# Patient Record
Sex: Female | Born: 1935 | Race: White | Hispanic: No
Health system: Southern US, Community
[De-identification: ages and names within clinical notes are randomized; demographics above are authoritative.]

## PROBLEM LIST (undated history)

## (undated) DIAGNOSIS — F0390 Unspecified dementia without behavioral disturbance: Secondary | ICD-10-CM

## (undated) DIAGNOSIS — I10 Essential (primary) hypertension: Secondary | ICD-10-CM

## (undated) DIAGNOSIS — E079 Disorder of thyroid, unspecified: Secondary | ICD-10-CM

## (undated) DIAGNOSIS — F419 Anxiety disorder, unspecified: Secondary | ICD-10-CM

## (undated) DIAGNOSIS — F329 Major depressive disorder, single episode, unspecified: Secondary | ICD-10-CM

---

## 2015-09-29 ENCOUNTER — Inpatient Hospital Stay (HOSPITAL_COMMUNITY)
Admission: EM | Admit: 2015-09-29 | Discharge: 2015-10-03 | DRG: 481 | Disposition: A | Discharge status: Skilled Nursing Facility | Payer: Medicare Other | Attending: Internal Medicine | Admitting: Internal Medicine

## 2015-09-29 ENCOUNTER — Emergency Department (HOSPITAL_COMMUNITY): Payer: Medicare Other

## 2015-09-29 ENCOUNTER — Encounter (HOSPITAL_COMMUNITY): Payer: Self-pay | Admitting: *Deleted

## 2015-09-29 DIAGNOSIS — I10 Essential (primary) hypertension: Secondary | ICD-10-CM | POA: Diagnosis present

## 2015-09-29 DIAGNOSIS — Z9109 Other allergy status, other than to drugs and biological substances: Secondary | ICD-10-CM | POA: Diagnosis not present

## 2015-09-29 DIAGNOSIS — Z66 Do not resuscitate: Secondary | ICD-10-CM | POA: Diagnosis present

## 2015-09-29 DIAGNOSIS — F0280 Dementia in other diseases classified elsewhere without behavioral disturbance: Secondary | ICD-10-CM | POA: Diagnosis present

## 2015-09-29 DIAGNOSIS — Z419 Encounter for procedure for purposes other than remedying health state, unspecified: Secondary | ICD-10-CM

## 2015-09-29 DIAGNOSIS — Y92129 Unspecified place in nursing home as the place of occurrence of the external cause: Secondary | ICD-10-CM | POA: Diagnosis not present

## 2015-09-29 DIAGNOSIS — N390 Urinary tract infection, site not specified: Secondary | ICD-10-CM

## 2015-09-29 DIAGNOSIS — F418 Other specified anxiety disorders: Secondary | ICD-10-CM | POA: Diagnosis not present

## 2015-09-29 DIAGNOSIS — E039 Hypothyroidism, unspecified: Secondary | ICD-10-CM | POA: Diagnosis present

## 2015-09-29 DIAGNOSIS — E876 Hypokalemia: Secondary | ICD-10-CM | POA: Diagnosis present

## 2015-09-29 DIAGNOSIS — Z79899 Other long term (current) drug therapy: Secondary | ICD-10-CM | POA: Diagnosis not present

## 2015-09-29 DIAGNOSIS — S72009A Fracture of unspecified part of neck of unspecified femur, initial encounter for closed fracture: Secondary | ICD-10-CM | POA: Diagnosis present

## 2015-09-29 DIAGNOSIS — S72012A Unspecified intracapsular fracture of left femur, initial encounter for closed fracture: Secondary | ICD-10-CM | POA: Diagnosis present

## 2015-09-29 DIAGNOSIS — F419 Anxiety disorder, unspecified: Secondary | ICD-10-CM | POA: Diagnosis present

## 2015-09-29 DIAGNOSIS — S72142A Displaced intertrochanteric fracture of left femur, initial encounter for closed fracture: Secondary | ICD-10-CM | POA: Diagnosis not present

## 2015-09-29 DIAGNOSIS — W19XXXA Unspecified fall, initial encounter: Secondary | ICD-10-CM | POA: Diagnosis present

## 2015-09-29 DIAGNOSIS — F329 Major depressive disorder, single episode, unspecified: Secondary | ICD-10-CM | POA: Diagnosis present

## 2015-09-29 DIAGNOSIS — D62 Acute posthemorrhagic anemia: Secondary | ICD-10-CM | POA: Diagnosis not present

## 2015-09-29 DIAGNOSIS — S72142S Displaced intertrochanteric fracture of left femur, sequela: Secondary | ICD-10-CM | POA: Diagnosis not present

## 2015-09-29 DIAGNOSIS — F0390 Unspecified dementia without behavioral disturbance: Secondary | ICD-10-CM | POA: Diagnosis present

## 2015-09-29 DIAGNOSIS — E440 Moderate protein-calorie malnutrition: Secondary | ICD-10-CM | POA: Diagnosis present

## 2015-09-29 DIAGNOSIS — F32A Depression, unspecified: Secondary | ICD-10-CM | POA: Diagnosis present

## 2015-09-29 HISTORY — DX: Anxiety disorder, unspecified: F41.9

## 2015-09-29 HISTORY — DX: Disorder of thyroid, unspecified: E07.9

## 2015-09-29 HISTORY — DX: Unspecified dementia, unspecified severity, without behavioral disturbance, psychotic disturbance, mood disturbance, and anxiety: F03.90

## 2015-09-29 HISTORY — DX: Essential (primary) hypertension: I10

## 2015-09-29 HISTORY — DX: Major depressive disorder, single episode, unspecified: F32.9

## 2015-09-29 LAB — URINE MICROSCOPIC-ADD ON

## 2015-09-29 LAB — COMPREHENSIVE METABOLIC PANEL
ALK PHOS: 43 U/L (ref 38–126)
ALT: 25 U/L (ref 14–54)
ANION GAP: 6 (ref 5–15)
AST: 29 U/L (ref 15–41)
Albumin: 4.1 g/dL (ref 3.5–5.0)
BILIRUBIN TOTAL: 1.3 mg/dL — AB (ref 0.3–1.2)
BUN: 32 mg/dL — ABNORMAL HIGH (ref 6–20)
CALCIUM: 8.5 mg/dL — AB (ref 8.9–10.3)
CO2: 27 mmol/L (ref 22–32)
CREATININE: 0.77 mg/dL (ref 0.44–1.00)
Chloride: 104 mmol/L (ref 101–111)
Glucose, Bld: 107 mg/dL — ABNORMAL HIGH (ref 65–99)
Potassium: 3.4 mmol/L — ABNORMAL LOW (ref 3.5–5.1)
Sodium: 137 mmol/L (ref 135–145)
TOTAL PROTEIN: 6.4 g/dL — AB (ref 6.5–8.1)

## 2015-09-29 LAB — CBC WITH DIFFERENTIAL/PLATELET
BASOS ABS: 0 10*3/uL (ref 0.0–0.1)
BASOS PCT: 0 %
EOS ABS: 0.1 10*3/uL (ref 0.0–0.7)
Eosinophils Relative: 2 %
HCT: 39.4 % (ref 36.0–46.0)
HEMOGLOBIN: 13.1 g/dL (ref 12.0–15.0)
Lymphocytes Relative: 17 %
Lymphs Abs: 1.2 10*3/uL (ref 0.7–4.0)
MCH: 31.4 pg (ref 26.0–34.0)
MCHC: 33.2 g/dL (ref 30.0–36.0)
MCV: 94.5 fL (ref 78.0–100.0)
MONOS PCT: 5 %
Monocytes Absolute: 0.4 10*3/uL (ref 0.1–1.0)
NEUTROS ABS: 5.5 10*3/uL (ref 1.7–7.7)
NEUTROS PCT: 76 %
Platelets: 163 10*3/uL (ref 150–400)
RBC: 4.17 MIL/uL (ref 3.87–5.11)
RDW: 13.1 % (ref 11.5–15.5)
WBC: 7.2 10*3/uL (ref 4.0–10.5)

## 2015-09-29 LAB — CBC
HCT: 37.3 % (ref 36.0–46.0)
HEMOGLOBIN: 12.1 g/dL (ref 12.0–15.0)
MCH: 30.9 pg (ref 26.0–34.0)
MCHC: 32.4 g/dL (ref 30.0–36.0)
MCV: 95.2 fL (ref 78.0–100.0)
Platelets: 165 10*3/uL (ref 150–400)
RBC: 3.92 MIL/uL (ref 3.87–5.11)
RDW: 13 % (ref 11.5–15.5)
WBC: 6.9 10*3/uL (ref 4.0–10.5)

## 2015-09-29 LAB — TSH: TSH: 17.851 u[IU]/mL — ABNORMAL HIGH (ref 0.350–4.500)

## 2015-09-29 LAB — CREATININE, SERUM
CREATININE: 0.7 mg/dL (ref 0.44–1.00)
GFR calc Af Amer: >60 mL/min (ref >60)

## 2015-09-29 LAB — SAMPLE TO BLOOD BANK

## 2015-09-29 LAB — URINALYSIS, ROUTINE W REFLEX MICROSCOPIC
BILIRUBIN URINE: NEGATIVE
GLUCOSE, UA: NEGATIVE mg/dL
KETONES UR: NEGATIVE mg/dL
NITRITE: POSITIVE — AB
PH: 6 (ref 5.0–8.0)
PROTEIN: NEGATIVE mg/dL
Specific Gravity, Urine: 1.025 (ref 1.005–1.030)

## 2015-09-29 LAB — PROTIME-INR
INR: 0.99 (ref 0.00–1.49)
PROTHROMBIN TIME: 13.3 s (ref 11.6–15.2)

## 2015-09-29 LAB — APTT: APTT: 31 s (ref 24–37)

## 2015-09-29 LAB — MRSA PCR SCREENING: MRSA by PCR: NEGATIVE

## 2015-09-29 MED ORDER — HYDROCODONE-ACETAMINOPHEN 5-325 MG PO TABS
1.0000 | ORAL_TABLET | Freq: Four times a day (QID) | ORAL | Status: DC | PRN
  Administered 2015-09-29: 1 via ORAL
  Administered 2015-09-30: 2 via ORAL
  Filled 2015-09-29: qty 2
  Filled 2015-09-29: qty 1

## 2015-09-29 MED ORDER — DOCUSATE SODIUM 100 MG PO CAPS
100.0000 mg | ORAL_CAPSULE | Freq: Every day | ORAL | Status: DC
Start: 2015-09-29 — End: 2015-09-30
  Administered 2015-09-30: 100 mg via ORAL
  Filled 2015-09-29 (×2): qty 1

## 2015-09-29 MED ORDER — HYDROCODONE-ACETAMINOPHEN 5-325 MG PO TABS: 1.0000 | ORAL_TABLET | Freq: Four times a day (QID) | ORAL | Status: DC | PRN

## 2015-09-29 MED ORDER — DEXTROSE 5 % IV SOLN
1.0000 g | Freq: Once | INTRAVENOUS | Status: AC
  Administered 2015-09-29: 1 g via INTRAVENOUS
  Filled 2015-09-29: qty 10

## 2015-09-29 MED ORDER — BUSPIRONE HCL 5 MG PO TABS
15.0000 mg | ORAL_TABLET | Freq: Two times a day (BID) | ORAL | Status: DC
  Administered 2015-09-29 – 2015-10-03 (×8): 15 mg via ORAL
  Filled 2015-09-29 (×8): qty 1

## 2015-09-29 MED ORDER — ENOXAPARIN SODIUM 40 MG/0.4ML ~~LOC~~ SOLN: 40.0000 mg | SUBCUTANEOUS | Status: DC

## 2015-09-29 MED ORDER — SODIUM CHLORIDE 0.9 % IV SOLN
INTRAVENOUS | Status: DC
  Administered 2015-09-29: 1000 mL via INTRAVENOUS

## 2015-09-29 MED ORDER — FENTANYL CITRATE (PF) 100 MCG/2ML IJ SOLN
25.0000 ug | Freq: Once | INTRAMUSCULAR | Status: AC
  Administered 2015-09-29: 25 ug via INTRAVENOUS
  Filled 2015-09-29: qty 2

## 2015-09-29 MED ORDER — MORPHINE SULFATE (PF) 2 MG/ML IV SOLN
0.5000 mg | INTRAVENOUS | Status: DC | PRN
  Administered 2015-10-01: 0.5 mg via INTRAVENOUS
  Filled 2015-09-29: qty 1

## 2015-09-29 MED ORDER — LEVOTHYROXINE SODIUM 75 MCG PO TABS
75.0000 ug | ORAL_TABLET | Freq: Every day | ORAL | Status: DC
  Administered 2015-09-30 – 2015-10-01 (×2): 75 ug via ORAL
  Filled 2015-09-29 (×2): qty 1

## 2015-09-29 MED ORDER — ACETAMINOPHEN 500 MG PO TABS
500.0000 mg | ORAL_TABLET | Freq: Four times a day (QID) | ORAL | Status: DC | PRN
  Administered 2015-09-29: 500 mg via ORAL
  Filled 2015-09-29 (×2): qty 1

## 2015-09-29 MED ORDER — FENTANYL CITRATE (PF) 100 MCG/2ML IJ SOLN
25.0000 ug | Freq: Once | INTRAMUSCULAR | Status: AC
Start: 2015-09-29 — End: 2015-09-29
  Administered 2015-09-29: 25 ug via INTRAVENOUS
  Filled 2015-09-29: qty 2

## 2015-09-29 MED ORDER — ATENOLOL 12.5 MG HALF TABLET
12.5000 mg | ORAL_TABLET | Freq: Every day | ORAL | Status: DC
  Administered 2015-09-30 – 2015-10-03 (×3): 12.5 mg via ORAL
  Filled 2015-09-29 (×5): qty 1

## 2015-09-29 MED ORDER — ENOXAPARIN SODIUM 30 MG/0.3ML ~~LOC~~ SOLN
30.0000 mg | SUBCUTANEOUS | Status: DC
  Filled 2015-09-29 (×2): qty 0.3

## 2015-09-29 MED ORDER — SENNA 8.6 MG PO TABS
1.0000 | ORAL_TABLET | Freq: Two times a day (BID) | ORAL | Status: DC
  Administered 2015-09-29 – 2015-10-03 (×8): 8.6 mg via ORAL
  Filled 2015-09-29 (×8): qty 1

## 2015-09-29 MED ORDER — MORPHINE SULFATE (PF) 2 MG/ML IV SOLN
0.5000 mg | INTRAVENOUS | Status: DC | PRN
Start: 2015-09-29 — End: 2015-09-29

## 2015-09-29 MED ORDER — ESCITALOPRAM OXALATE 10 MG PO TABS
15.0000 mg | ORAL_TABLET | Freq: Every day | ORAL | Status: DC
  Administered 2015-09-30 – 2015-10-03 (×4): 15 mg via ORAL
  Filled 2015-09-29 (×5): qty 2

## 2015-09-29 MED ORDER — POTASSIUM CHLORIDE 10 MEQ/100ML IV SOLN
10.0000 meq | INTRAVENOUS | Status: AC
  Administered 2015-09-29 (×3): 10 meq via INTRAVENOUS
  Filled 2015-09-29 (×3): qty 100

## 2015-09-29 NOTE — H&P (Signed)
History and Physical    Debra Flores ZOX:[id number] DOB: [birth year] DOA: 09/29/2015  PCP: No primary care provider on file. Consultants:  ? Patient coming from: SNF  Chief Complaint: fall  HPI: Debra Flores is a 82 y.o. female with medical history significant of advanced dementia.  She was brought in by EMS after falling at the SNF where she lives.  She c/o pain in her right hip with movement, per Dr. Delford FieldKnapp's note.  The patient is quite tangential and unable to answer any questions appropriately.  Her son has been notified of the situation [place]; he confirms that she is DNR.    ED Course:  Given Rocephin 1 gram IV for possible UTI; found to have a left intertrochanteric hip fracture and so [facility]  Review of Systems: Unable to perform due to advanced dementia    Past Medical History  Diagnosis Date  . Anxiety   . Thyroid disease   . Hypertension   . Depression, major (HCC)   . Dementia     History reviewed. No pertinent past surgical history.  Social History   Social History  . Marital Status: Widowed    Spouse Name: N/A  . Number of Children: N/A  . Years of Education: N/A   Occupational History  . Not on file.   Social History Main Topics  . Smoking status: Unknown If Ever Smoked  . Smokeless tobacco: Not on file  . Alcohol Use: No  . Drug Use: No  . Sexual Activity: Not on file   Other Topics Concern  . Not on file   Social History Narrative  . No narrative on file    Allergies  Allergen Reactions  . Aricept [Donepezil Hcl]     History reviewed. No pertinent family history.  Prior to Admission medications   Medication Sig Start Date End Date Taking? Authorizing Provider  acetaminophen (TYLENOL) 500 MG tablet Take 500 mg by mouth 2 (two) times daily.   Yes Historical Provider, MD  atenolol (TENORMIN) 25 MG tablet Take 12.5 mg by mouth daily.   Yes Historical Provider, MD  busPIRone (BUSPAR)  15 MG tablet Take 15 mg by mouth 2 (two) times daily.   Yes Historical Provider, MD  docusate sodium (COLACE) 100 MG capsule Take 100 mg by mouth daily.   Yes Historical Provider, MD  escitalopram (LEXAPRO) 10 MG tablet Take 15 mg by mouth daily.   Yes Historical Provider, MD  levothyroxine (SYNTHROID, LEVOTHROID) 50 MCG tablet Take 75 mcg by mouth daily before breakfast.   Yes Historical Provider, MD  Skin Protectants, Misc. (EUCERIN) cream Apply topically 2 (two) times daily.   Yes Historical Provider, MD    Physical Exam: Filed Vitals:   09/29/15 0218 09/29/15 0456 09/29/15 0626  BP: 167/87 150/79 176/78  Pulse: 67 86 83  Temp: 97.9 F (36.6 C)    TempSrc: Oral    Resp: 22 20 20   Weight: 45.36 kg (100 lb)    SpO2: 98% 96% 99%     General:  Appears calm and comfortable and is NAD Eyes:  PERRL, EOMI, normal lids, iris ENT:  grossly normal hearing, lips & tongue, mmm Neck:  no LAD, masses or thyromegaly Cardiovascular:  RRR, no m/r/g. No LE edema.  Respiratory:  CTA bilaterally, no w/r/r. Normal respiratory effort. Abdomen:  soft, ntnd, NABS Skin:  no rash or induration seen on limited exam Musculoskeletal: legs are symmetric and no leg  length discrepancy appreciated; 2+ distal pulses bilaterally Psychiatric:  grossly normal mood and affect, speech fluent, alert, spoke with me about whether I would be doing the dishes and various other nonsensical things but unable to answer questions appropriately Neurologic: grossly intact, unable to participate  Labs on Admission: I have personally reviewed following labs and imaging studies  CBC:  Recent Labs Lab 09/29/15 0412  WBC 7.2  NEUTROABS 5.5  HGB 13.1  HCT 39.4  MCV 94.5  PLT 163   Basic Metabolic Panel:  Recent Labs Lab 09/29/15 0412  NA 137  K 3.4*  CL 104  CO2 27  GLUCOSE 107*  BUN 32*  CREATININE 0.77  CALCIUM 8.5*   GFR: CrCl cannot be calculated (Unknown ideal weight.). Liver Function Tests:  Recent  Labs Lab 09/29/15 0412  AST 29  ALT 25  ALKPHOS 43  BILITOT 1.3*  PROT 6.4*  ALBUMIN 4.1   No results for input(s): LIPASE, AMYLASE in the last 168 hours. No results for input(s): AMMONIA in the last 168 hours. Coagulation Profile:  Recent Labs Lab 09/29/15 0412  INR 0.99   Cardiac Enzymes: No results for input(s): CKTOTAL, CKMB, CKMBINDEX, TROPONINI in the last 168 hours. BNP (last 3 results) No results for input(s): PROBNP in the last 8760 hours. HbA1C: No results for input(s): HGBA1C in the last 72 hours. CBG: No results for input(s): GLUCAP in the last 168 hours. Lipid Profile: No results for input(s): CHOL, HDL, LDLCALC, TRIG, CHOLHDL, LDLDIRECT in the last 72 hours. Thyroid Function Tests: No results for input(s): TSH, T4TOTAL, FREET4, T3FREE, THYROIDAB in the last 72 hours. Anemia Panel: No results for input(s): VITAMINB12, FOLATE, FERRITIN, TIBC, IRON, RETICCTPCT in the last 72 hours. Urine analysis:    Component Value Date/Time   COLORURINE YELLOW 09/29/2015 0440   APPEARANCEUR CLOUDY* 09/29/2015 0440   LABSPEC 1.025 09/29/2015 0440   PHURINE 6.0 09/29/2015 0440   GLUCOSEU NEGATIVE 09/29/2015 0440   HGBUR TRACE* 09/29/2015 0440   BILIRUBINUR NEGATIVE 09/29/2015 0440   KETONESUR NEGATIVE 09/29/2015 0440   PROTEINUR NEGATIVE 09/29/2015 0440   NITRITE POSITIVE* 09/29/2015 0440   LEUKOCYTESUR MODERATE* 09/29/2015 0440    Creatinine Clearance: CrCl cannot be calculated (Unknown ideal weight.).  Sepsis Labs: (procalcitonin:4,lacticidven:4) )No results found for this or any previous visit (from the past 240 hour(s)).   Radiological Exams on Admission: Dg Chest 1 View  09/29/2015  CLINICAL DATA:  Unwitnessed fall.  Hip fracture. EXAM: CHEST 1 VIEW COMPARISON:  None. FINDINGS: Mild cardiomegaly. There is tortuosity of the thoracic aorta with atherosclerosis. Mild coarse interstitial markings without evidence of pulmonary edema. No confluent airspace  disease, pleural effusion or pneumothorax. Scoliotic curvature in degenerative change in the spine. No evidence of displaced rib fracture. IMPRESSION: Mild cardiomegaly and tortuous atherosclerotic thoracic aorta. No definite acute abnormality. Electronically Signed   By: Rubye Oaks M.D.   On: 09/29/2015 04:01   Dg Pelvis 1-2 Views  09/29/2015  CLINICAL DATA:  82 year old post unwitnessed fall at nursing facility this morning. EXAM: PELVIS - 1-2 VIEW COMPARISON:  None. FINDINGS: Left subcapital femoral neck fracture with mild foreshortening. Right hip is grossly intact. Two weeks of the cysts and sacroiliac joints are congruent. No evidence of pelvic ring fracture. The bones are under mineralized. There is degenerative change of the lower lumbar spine. A stool ball distends the rectum. IMPRESSION: Mildly displaced left subcapital femoral neck fracture. Electronically Signed   By: Rubye Oaks M.D.   On: 09/29/2015 03:16  Dg Hip Unilat With Pelvis 2-3 Views Left  09/29/2015  CLINICAL DATA:  Unwitnessed fall at nursing facility this morning. Hip fracture. EXAM: DG HIP (WITH OR WITHOUT PELVIS) 2-3V LEFT COMPARISON:  Pelvis radiograph earlier this day. FINDINGS: The left proximal femur fracture involves the intertrochanteric femur. The femoral neck is intact. There is mild displacement. Femoral head is seated in the acetabulum. No rami fractures. IMPRESSION: Left proximal femur fracture involves the intertrochanteric femur, not the femoral neck as reported on prior exam. The femoral neck appears intact. Electronically Signed   By: Rubye Oaks M.D.   On: 09/29/2015 04:00    EKG: Not done  Assessment/Plan Principal Problem:   Intertrochanteric fracture of left femur (HCC) Active Problems:   Anxiety and depression   Hypothyroidism   Dementia   Benign essential HTN   Hip fracture requiring operative repair (HCC)   Hypokalemia   Left hip fracture -Dr. Ophelia Charter at [facility] consulted and will  likely do ORIF on 7/10 -Will transfer to [facility] accordingly -Hip fracture protocol implemented  Dementia -Patient with advanced dementia -Unable to answer questions appropriately or participate in care -Likely will need to return to SNF following hip fracture repair as she is probably unable to participate in meaningful rehab  HTN -Continue Atenolol - since she is taking preoperatively, she is likely to do better perioperatively if she continues this medication  Hypokalemia -Will replete and recheck BMP in AM  Anxiety/depression -Continue Buspar and Lexapro -Does not appear overly anxious or depressed at the time of admission  Hypothyroidism -No recent TSH in our system, will recheck -Will continue Synthroid at home dose for now  Abnormal UA -Patient without apparent symptoms, but obviously difficult to tell -Given one dose of Rocephin in ER -Should receive perioperative antibiotics that are likely sufficient if she does have a mild infection so will not continue treatment at this time.   DVT prophylaxis: Lovenox Code Status: DNR - confirmed with patient's son via telephone Family Communication: Telephone conversation with son while he is en route Disposition Plan: Back to SNF once clinically improved Consults called: Orthopedics Admission status: Admit    Jonah Blue MD [facility]  If 7PM-7AM, please contact night-coverage [email or web address] Password [facility]  09/29/2015, 6:39 AM

## 2015-09-29 NOTE — Progress Notes (Signed)
[facility] in [facility].  Manson Passeylma Jenia Klepper [facility] [phone]

## 2015-09-29 NOTE — ED Provider Notes (Signed)
CSN: [id number]     Arrival date & time 09/29/15  0214 History   First MD Initiated Contact with Patient 09/29/15 0250 am     Chief Complaint  Patient presents with  . Fall   Level V caveat for dementia  (Consider location/radiation/quality/duration/timing/severity/associated sxs/prior Treatment) HPI  Patient brought to the emergency department by EMS after a fall in her nursing facility. They report she has pain in her right hip when she is moved. Patient is unable to answer any questions. When I ask her if she is Mrs. Notarianni, she repeats to me "Are you Mrs Sagrero?".  Patient has no idea where she is.  PCP Dr. Gerlene Burdock Rhes  Past Medical History  Diagnosis Date  . Anxiety   . Thyroid disease   . Hypertension   . Depression, major (HCC)   . Dementia    History reviewed. No pertinent past surgical history. History reviewed. No pertinent family history. Social History  Substance Use Topics  . Smoking status: Unknown If Ever Smoked  . Smokeless tobacco: None  . Alcohol Use: No  lives in [place]   OB History    No data available     Review of Systems  Unable to perform ROS: Dementia      Allergies  Aricept  Home Medications   Prior to Admission medications   Medication Sig Start Date End Date Taking? Authorizing Provider  acetaminophen (TYLENOL) 500 MG tablet Take 500 mg by mouth 2 (two) times daily.   Yes Historical Provider, MD  atenolol (TENORMIN) 25 MG tablet Take 12.5 mg by mouth daily.   Yes Historical Provider, MD  busPIRone (BUSPAR) 15 MG tablet Take 15 mg by mouth 2 (two) times daily.   Yes Historical Provider, MD  docusate sodium (COLACE) 100 MG capsule Take 100 mg by mouth daily.   Yes Historical Provider, MD  escitalopram (LEXAPRO) 10 MG tablet Take 15 mg by mouth daily.   Yes Historical Provider, MD  levothyroxine (SYNTHROID, LEVOTHROID) 50 MCG tablet Take 75 mcg by mouth daily before breakfast.   Yes Historical Provider, MD  Skin Protectants, Misc. (EUCERIN)  cream Apply topically 2 (two) times daily.   Yes Historical Provider, MD   BP 176/78 mmHg  Pulse 83  Temp(Src) 97.9 F (36.6 C) (Oral)  Resp 20  Wt 100 lb (45.36 kg)  SpO2 99% Physical Exam  Constitutional:  Non-toxic appearance. She does not appear ill. No distress.  Thin elderly female  HENT:  Head: Normocephalic and atraumatic.  Right Ear: External ear normal.  Left Ear: External ear normal.  Nose: Nose normal. No mucosal edema or rhinorrhea.  Mouth/Throat: Oropharynx is clear and moist and mucous membranes are normal. No dental abscesses or uvula swelling.  Eyes: Conjunctivae and EOM are normal. Pupils are equal, round, and reactive to light.  Neck: Normal range of motion and full passive range of motion without pain. Neck supple.  Cardiovascular: Normal rate, regular rhythm and normal heart sounds.  Exam reveals no gallop and no friction rub.   No murmur heard. Pulmonary/Chest: Effort normal and breath sounds normal. No respiratory distress. She has no wheezes. She has no rhonchi. She has no rales. She exhibits no tenderness and no crepitus.  Abdominal: Soft. Normal appearance and bowel sounds are normal. She exhibits no distension. There is no tenderness. There is no rebound and no guarding.  Musculoskeletal: Normal range of motion. She exhibits no edema or tenderness.  Patient's right leg appears to be slightly longer than  the left however there is no internal or external rotation.  Neurological: She is alert. She has normal strength. No cranial nerve deficit.  Skin: Skin is warm, dry and intact. No rash noted. No erythema. No pallor.  Psychiatric: She has a normal mood and affect. Her speech is normal and behavior is normal. Her mood appears not anxious.  Nursing note and vitals reviewed.   ED Course  Procedures (including critical care time)  Medications  0.9 %  sodium chloride infusion (1,000 mLs Intravenous New Bag/Given 09/29/15 0443)  potassium chloride 10 mEq in 100  mL IVPB (10 mEq Intravenous New Bag/Given 09/29/15 0645)  fentaNYL (SUBLIMAZE) injection 25 mcg (25 mcg Intravenous Given 09/29/15 0444)  cefTRIAXone (ROCEPHIN) 1 g in dextrose 5 % 50 mL IVPB (0 g Intravenous Stopped 09/29/15 0554)   I initially ordered a pelvis to try to help localize where her pain is located. EMS reported her pain was on the right however when I examined her her left leg is the shorter one. After reviewing her pelvis left hip films were ordered.  After reviewing her laboratory testing patient hears to have a UTI. A urine culture was sent and she was given Rocephin 1 g IV.  05:32 AM I spoke to her son, Eulis Cannereil Jacques, he wants her to have surgery and understands [facility] because we don't have an orthopedist here this weekend. [place] and will be back in town later this after noon. He states she is normally ambulatory and she talks however she is confused.  5:36 AM Dr. Ophelia CharterYates, orthopedist on call, states he will probably operate on her tomorrow, July 10. [facility].  05:48 AM Dr Ophelia CharterYates, hospitalist, will come do admission [facility].   Labs Review Results for orders placed or performed during the hospital encounter of 09/29/15  Comprehensive metabolic panel  Result Value Ref Range   Sodium 137 135 - 145 mmol/L   Potassium 3.4 (L) 3.5 - 5.1 mmol/L   Chloride 104 101 - 111 mmol/L   CO2 27 22 - 32 mmol/L   Glucose, Bld 107 (H) 65 - 99 mg/dL   BUN 32 (H) 6 - 20 mg/dL   Creatinine, Ser 4.540.77 0.44 - 1.00 mg/dL   Calcium 8.5 (L) 8.9 - 10.3 mg/dL   Total Protein 6.4 (L) 6.5 - 8.1 g/dL   Albumin 4.1 3.5 - 5.0 g/dL   AST 29 15 - 41 U/L   ALT 25 14 - 54 U/L   Alkaline Phosphatase 43 38 - 126 U/L   Total Bilirubin 1.3 (H) 0.3 - 1.2 mg/dL   GFR calc non Af Amer >60 >60 mL/min   GFR calc Af Amer >60 >60 mL/min   Anion gap 6 5 - 15  CBC with Differential   Result Value Ref Range   WBC 7.2 4.0 - 10.5 K/uL   RBC 4.17 3.87 - 5.11 MIL/uL   Hemoglobin 13.1 12.0 - 15.0 g/dL   HCT 09.839.4 11.936.0 - 14.746.0 %   MCV 94.5 78.0 - 100.0 fL   MCH 31.4 26.0 - 34.0 pg   MCHC 33.2 30.0 - 36.0 g/dL   RDW 82.913.1 56.211.5 - 13.015.5 %   Platelets 163 150 - 400 K/uL   Neutrophils Relative % 76 %   Neutro Abs 5.5 1.7 - 7.7 K/uL   Lymphocytes Relative 17 %   Lymphs  Abs 1.2 0.7 - 4.0 K/uL   Monocytes Relative 5 %   Monocytes Absolute 0.4 0.1 - 1.0 K/uL   Eosinophils Relative 2 %   Eosinophils Absolute 0.1 0.0 - 0.7 K/uL   Basophils Relative 0 %   Basophils Absolute 0.0 0.0 - 0.1 K/uL  Protime-INR  Result Value Ref Range   Prothrombin Time 13.3 11.6 - 15.2 seconds   INR 0.99 0.00 - 1.49  APTT  Result Value Ref Range   aPTT 31 24 - 37 seconds  Urinalysis, Routine w reflex microscopic (not at [facility])  Result Value Ref Range   Color, Urine YELLOW YELLOW   APPearance CLOUDY (A) CLEAR   Specific Gravity, Urine 1.025 1.005 - 1.030   pH 6.0 5.0 - 8.0   Glucose, UA NEGATIVE NEGATIVE mg/dL   Hgb urine dipstick TRACE (A) NEGATIVE   Bilirubin Urine NEGATIVE NEGATIVE   Ketones, ur NEGATIVE NEGATIVE mg/dL   Protein, ur NEGATIVE NEGATIVE mg/dL   Nitrite POSITIVE (A) NEGATIVE   Leukocytes, UA MODERATE (A) NEGATIVE  Urine microscopic-add on  Result Value Ref Range   Squamous Epithelial / LPF 0-5 (A) NONE SEEN   WBC, UA 6-30 0 - 5 WBC/hpf   RBC / HPF 0-5 0 - 5 RBC/hpf   Bacteria, UA MANY (A) NONE SEEN  Sample to Blood Bank  Result Value Ref Range   Blood Bank Specimen [id number]    Sample Expiration 09/30/2015    Laboratory interpretation all normal except possible UTI     Imaging Review Dg Chest 1 View  09/29/2015  CLINICAL DATA:  Unwitnessed fall.  Hip fracture. EXAM: CHEST 1 VIEW COMPARISON:  None. FINDINGS: Mild cardiomegaly. There is tortuosity of the thoracic aorta with atherosclerosis. Mild coarse interstitial markings without evidence of pulmonary edema. No confluent  airspace disease, pleural effusion or pneumothorax. Scoliotic curvature in degenerative change in the spine. No evidence of displaced rib fracture. IMPRESSION: Mild cardiomegaly and tortuous atherosclerotic thoracic aorta. No definite acute abnormality. Electronically Signed   By: Rubye Oaks M.D.   On: 09/29/2015 04:01   Dg Pelvis 1-2 Views  09/29/2015  CLINICAL DATA:  82 year old post unwitnessed fall at nursing facility this morning. EXAM: PELVIS - 1-2 VIEW COMPARISON:  None. FINDINGS: Left subcapital femoral neck fracture with mild foreshortening. Right hip is grossly intact. Two weeks of the cysts and sacroiliac joints are congruent. No evidence of pelvic ring fracture. The bones are under mineralized. There is degenerative change of the lower lumbar spine. A stool ball distends the rectum. IMPRESSION: Mildly displaced left subcapital femoral neck fracture. Electronically Signed   By: Rubye Oaks M.D.   On: 09/29/2015 03:16   Dg Hip Unilat With Pelvis 2-3 Views Left  09/29/2015  CLINICAL DATA:  Unwitnessed fall at nursing facility this morning. Hip fracture. EXAM: DG HIP (WITH OR WITHOUT PELVIS) 2-3V LEFT COMPARISON:  Pelvis radiograph earlier this day. FINDINGS: The left proximal femur fracture involves the intertrochanteric femur. The femoral neck is intact. There is mild displacement. Femoral head is seated in the acetabulum. No rami fractures. IMPRESSION: Left proximal femur fracture involves the intertrochanteric femur, not the femoral neck as reported on prior exam. The femoral neck appears intact. Electronically Signed   By: Rubye Oaks M.D.   On: 09/29/2015 04:00   I have personally reviewed and evaluated these images and lab results as part of my medical decision-making.     MDM   Final diagnoses:  Fall at nursing home, initial encounter  Intertrochanteric  fracture of left femur, closed, initial encounter [facility])  UTI (lower urinary tract infection)    Plan transfer to  [facility] for admission   Devoria Albe, MD, Concha Pyo, MD 09/29/15 [phone]

## 2015-09-29 NOTE — Consult Note (Signed)
Reason for Consult:left IT hip fracture, closed Referring Physician: Charlies Silvers MD  Debra Flores is an 82 y.o. female.  HPI: 8 with dementia in SNF with fall and left IT hip fracture. Had fentanyl before transport and sleeping.   Past Medical History  Diagnosis Date  . Anxiety   . Thyroid disease   . Hypertension   . Depression, major ([place])   . Dementia     History reviewed. No pertinent past surgical history.  History reviewed. No pertinent family history.  Social History:  reports that she does not drink alcohol or use illicit drugs. Her tobacco history is not on file.  Allergies:  Allergies  Allergen Reactions  . Aricept [Donepezil Hcl]     Medications: I have reviewed the patient's current medications.  Results for orders placed or performed during the hospital encounter of 09/29/15 (from the past 48 hour(s))  Comprehensive metabolic panel     Status: Abnormal   Collection Time: 09/29/15  4:12 AM  Result Value Ref Range   Sodium 137 135 - 145 mmol/L   Potassium 3.4 (L) 3.5 - 5.1 mmol/L   Chloride 104 101 - 111 mmol/L   CO2 27 22 - 32 mmol/L   Glucose, Bld 107 (H) 65 - 99 mg/dL   BUN 32 (H) 6 - 20 mg/dL   Creatinine, Ser 0.77 0.44 - 1.00 mg/dL   Calcium 8.5 (L) 8.9 - 10.3 mg/dL   Total Protein 6.4 (L) 6.5 - 8.1 g/dL   Albumin 4.1 3.5 - 5.0 g/dL   AST 29 15 - 41 U/L   ALT 25 14 - 54 U/L   Alkaline Phosphatase 43 38 - 126 U/L   Total Bilirubin 1.3 (H) 0.3 - 1.2 mg/dL   GFR calc non Af Amer >60 >60 mL/min   GFR calc Af Amer >60 >60 mL/min    Comment: (NOTE) The eGFR has been calculated using the CKD EPI equation. This calculation has not been validated in all clinical situations. eGFR's persistently <60 mL/min signify possible Chronic Kidney Disease.    Anion gap 6 5 - 15  CBC with Differential     Status: None   Collection Time: 09/29/15  4:12 AM  Result Value Ref Range   WBC 7.2 4.0 - 10.5 K/uL   RBC 4.17 3.87 - 5.11 MIL/uL   Hemoglobin 13.1 12.0 - 15.0  g/dL   HCT 39.4 36.0 - 46.0 %   MCV 94.5 78.0 - 100.0 fL   MCH 31.4 26.0 - 34.0 pg   MCHC 33.2 30.0 - 36.0 g/dL   RDW 13.1 11.5 - 15.5 %   Platelets 163 150 - 400 K/uL   Neutrophils Relative % 76 %   Neutro Abs 5.5 1.7 - 7.7 K/uL   Lymphocytes Relative 17 %   Lymphs Abs 1.2 0.7 - 4.0 K/uL   Monocytes Relative 5 %   Monocytes Absolute 0.4 0.1 - 1.0 K/uL   Eosinophils Relative 2 %   Eosinophils Absolute 0.1 0.0 - 0.7 K/uL   Basophils Relative 0 %   Basophils Absolute 0.0 0.0 - 0.1 K/uL  Protime-INR     Status: None   Collection Time: 09/29/15  4:12 AM  Result Value Ref Range   Prothrombin Time 13.3 11.6 - 15.2 seconds   INR 0.99 0.00 - 1.49  APTT     Status: None   Collection Time: 09/29/15  4:12 AM  Result Value Ref Range   aPTT 31 24 - 37 seconds  Sample to  Blood Bank     Status: None   Collection Time: 09/29/15  4:12 AM  Result Value Ref Range   Blood Bank Specimen BBHLD    Sample Expiration 09/30/2015   TSH     Status: Abnormal   Collection Time: 09/29/15  4:12 AM  Result Value Ref Range   TSH 17.851 (H) 0.350 - 4.500 uIU/mL  Urinalysis, Routine w reflex microscopic (not at [facility])     Status: Abnormal   Collection Time: 09/29/15  4:40 AM  Result Value Ref Range   Color, Urine YELLOW YELLOW   APPearance CLOUDY (A) CLEAR   Specific Gravity, Urine 1.025 1.005 - 1.030   pH 6.0 5.0 - 8.0   Glucose, UA NEGATIVE NEGATIVE mg/dL   Hgb urine dipstick TRACE (A) NEGATIVE   Bilirubin Urine NEGATIVE NEGATIVE   Ketones, ur NEGATIVE NEGATIVE mg/dL   Protein, ur NEGATIVE NEGATIVE mg/dL   Nitrite POSITIVE (A) NEGATIVE   Leukocytes, UA MODERATE (A) NEGATIVE  Urine microscopic-add on     Status: Abnormal   Collection Time: 09/29/15  4:40 AM  Result Value Ref Range   Squamous Epithelial / LPF 0-5 (A) NONE SEEN   WBC, UA 6-30 0 - 5 WBC/hpf   RBC / HPF 0-5 0 - 5 RBC/hpf   Bacteria, UA MANY (A) NONE SEEN  CBC     Status: None   Collection Time: 09/29/15 11:42 AM  Result Value Ref  Range   WBC 6.9 4.0 - 10.5 K/uL   RBC 3.92 3.87 - 5.11 MIL/uL   Hemoglobin 12.1 12.0 - 15.0 g/dL   HCT 37.3 36.0 - 46.0 %   MCV 95.2 78.0 - 100.0 fL   MCH 30.9 26.0 - 34.0 pg   MCHC 32.4 30.0 - 36.0 g/dL   RDW 13.0 11.5 - 15.5 %   Platelets 165 150 - 400 K/uL    Dg Chest 1 View  09/29/2015  CLINICAL DATA:  Unwitnessed fall.  Hip fracture. EXAM: CHEST 1 VIEW COMPARISON:  None. FINDINGS: Mild cardiomegaly. There is tortuosity of the thoracic aorta with atherosclerosis. Mild coarse interstitial markings without evidence of pulmonary edema. No confluent airspace disease, pleural effusion or pneumothorax. Scoliotic curvature in degenerative change in the spine. No evidence of displaced rib fracture. IMPRESSION: Mild cardiomegaly and tortuous atherosclerotic thoracic aorta. No definite acute abnormality. Electronically Signed   By: Jeb Levering M.D.   On: 09/29/2015 04:01   Dg Pelvis 1-2 Views  09/29/2015  CLINICAL DATA:  82 year old post unwitnessed fall at nursing facility this morning. EXAM: PELVIS - 1-2 VIEW COMPARISON:  None. FINDINGS: Left subcapital femoral neck fracture with mild foreshortening. Right hip is grossly intact. Two weeks of the cysts and sacroiliac joints are congruent. No evidence of pelvic ring fracture. The bones are under mineralized. There is degenerative change of the lower lumbar spine. A stool ball distends the rectum. IMPRESSION: Mildly displaced left subcapital femoral neck fracture. Electronically Signed   By: Jeb Levering M.D.   On: 09/29/2015 03:16   Dg Hip Unilat With Pelvis 2-3 Views Left  09/29/2015  CLINICAL DATA:  Unwitnessed fall at nursing facility this morning. Hip fracture. EXAM: DG HIP (WITH OR WITHOUT PELVIS) 2-3V LEFT COMPARISON:  Pelvis radiograph earlier this day. FINDINGS: The left proximal femur fracture involves the intertrochanteric femur. The femoral neck is intact. There is mild displacement. Femoral head is seated in the acetabulum. No rami  fractures. IMPRESSION: Left proximal femur fracture involves the intertrochanteric femur, not the femoral neck  as reported on prior exam. The femoral neck appears intact. Electronically Signed   By: Jeb Levering M.D.   On: 09/29/2015 04:00    Review of Systems  Unable to perform ROS: dementia   Blood pressure 142/63, pulse 78, temperature 98.1 F (36.7 C), temperature source Oral, resp. rate 18, weight 45.36 kg (100 lb), SpO2 98 %. Physical Exam  Constitutional: She appears well-developed and well-nourished.  HENT:  Head: Normocephalic.  Eyes: Pupils are equal, round, and reactive to light.  Neck: Normal range of motion.  Cardiovascular: Normal rate.   Respiratory: Effort normal.  GI: Soft.  Musculoskeletal:  Pain with left hip ROM. Pulses normal  Neurological:  Confused , nonconversant  Skin: Skin is warm and dry.  Psychiatric:  dementia    Assessment/Plan: Left IT hip fracture. Will proceed with troch IM nail to fix fracture tomorrow. When son is here I can discuss it with him by phone.    My cell is  [phone].   Surgery Monday at about 11 AM  YATES,MARK C 09/29/2015, 12:08 PM

## 2015-09-29 NOTE — Progress Notes (Signed)
Patient ID: Debra MainMargaret Bettenhausen, female   DOB: 1923/01/14, 82 y.o.   MRN: [id number] Hip fracture orders placed. [place] and he [place] sign consent form. When he arrives I can talk to him by phone. My cell [phone].  xrays show left IT hip fracture and she is scheduled for surgery at about 11AM Monday. Will need to be NPO after MN and hold Lovenox Monday for surgery.

## 2015-09-29 NOTE — ED Notes (Addendum)
Per EMS, staff states this is pt normal mental status. Abrasion to the left elbow cleaned w/ Shur Clens & covered.

## 2015-09-29 NOTE — ED Notes (Signed)
[facility] here.  Fentanyl given for transport.

## 2015-09-29 NOTE — ED Notes (Signed)
Pt arrived by EMS from [facility] [place]. Reported pt was found in floor. Pt says left hip hurting, was reaching for right hip when moved. EMS states skin tears to left arm.

## 2015-09-29 NOTE — ED Notes (Signed)
Patient son Debra Flores can be reached at [phone]

## 2015-09-30 ENCOUNTER — Inpatient Hospital Stay (HOSPITAL_COMMUNITY): Payer: Medicare Other | Admitting: Certified Registered"

## 2015-09-30 ENCOUNTER — Encounter (HOSPITAL_COMMUNITY)
Admission: EM | Disposition: A | Discharge status: Skilled Nursing Facility | Payer: Self-pay | Source: Home / Self Care | Attending: Internal Medicine

## 2015-09-30 ENCOUNTER — Inpatient Hospital Stay (HOSPITAL_COMMUNITY): Payer: Medicare Other

## 2015-09-30 HISTORY — PX: INTRAMEDULLARY (IM) NAIL INTERTROCHANTERIC: SHX5875

## 2015-09-30 LAB — BASIC METABOLIC PANEL
Anion gap: 7 (ref 5–15)
BUN: 18 mg/dL (ref 6–20)
CO2: 24 mmol/L (ref 22–32)
Calcium: 8.9 mg/dL (ref 8.9–10.3)
Chloride: 108 mmol/L (ref 101–111)
Creatinine, Ser: 0.76 mg/dL (ref 0.44–1.00)
GFR calc Af Amer: >60 mL/min (ref >60)
GLUCOSE: 96 mg/dL (ref 65–99)
POTASSIUM: 4 mmol/L (ref 3.5–5.1)
Sodium: 139 mmol/L (ref 135–145)

## 2015-09-30 LAB — CBC
HCT: 38.2 % (ref 36.0–46.0)
Hemoglobin: 12.2 g/dL (ref 12.0–15.0)
MCH: 30.3 pg (ref 26.0–34.0)
MCHC: 31.9 g/dL (ref 30.0–36.0)
MCV: 94.8 fL (ref 78.0–100.0)
PLATELETS: 175 10*3/uL (ref 150–400)
RBC: 4.03 MIL/uL (ref 3.87–5.11)
RDW: 13.1 % (ref 11.5–15.5)
WBC: 5.9 10*3/uL (ref 4.0–10.5)

## 2015-09-30 SURGERY — FIXATION, FRACTURE, INTERTROCHANTERIC, WITH INTRAMEDULLARY ROD
Anesthesia: General | Site: Hip | Laterality: Left

## 2015-09-30 MED ORDER — ACETAMINOPHEN 650 MG RE SUPP: 650.0000 mg | Freq: Four times a day (QID) | RECTAL | Status: DC | PRN

## 2015-09-30 MED ORDER — SUGAMMADEX SODIUM 200 MG/2ML IV SOLN
INTRAVENOUS | Status: DC | PRN
  Administered 2015-09-30: 100 mg via INTRAVENOUS

## 2015-09-30 MED ORDER — MIDAZOLAM HCL 2 MG/2ML IJ SOLN
INTRAMUSCULAR | Status: AC
  Filled 2015-09-30: qty 2

## 2015-09-30 MED ORDER — ONDANSETRON HCL 4 MG/2ML IJ SOLN
4.0000 mg | Freq: Four times a day (QID) | INTRAMUSCULAR | Status: DC | PRN
  Administered 2015-09-30: 4 mg via INTRAVENOUS
  Filled 2015-09-30: qty 2

## 2015-09-30 MED ORDER — ENSURE ENLIVE PO LIQD: 237.0000 mL | Freq: Two times a day (BID) | ORAL | Status: AC

## 2015-09-30 MED ORDER — CEFAZOLIN SODIUM-DEXTROSE 2-3 GM-% IV SOLR
INTRAVENOUS | Status: DC | PRN
  Administered 2015-09-30: 2 g via INTRAVENOUS

## 2015-09-30 MED ORDER — DEXAMETHASONE SODIUM PHOSPHATE 10 MG/ML IJ SOLN
INTRAMUSCULAR | Status: DC | PRN
  Administered 2015-09-30: 4 mg via INTRAVENOUS

## 2015-09-30 MED ORDER — PROPOFOL 10 MG/ML IV BOLUS
INTRAVENOUS | Status: DC | PRN
  Administered 2015-09-30: 60 mg via INTRAVENOUS

## 2015-09-30 MED ORDER — LIDOCAINE HCL (CARDIAC) 20 MG/ML IV SOLN
INTRAVENOUS | Status: DC | PRN
  Administered 2015-09-30: 80 mg via INTRAVENOUS

## 2015-09-30 MED ORDER — ASPIRIN 325 MG PO TBEC: 325.0000 mg | DELAYED_RELEASE_TABLET | Freq: Every day | ORAL | Status: DC

## 2015-09-30 MED ORDER — ACETAMINOPHEN 325 MG PO TABS
650.0000 mg | ORAL_TABLET | Freq: Four times a day (QID) | ORAL | Status: DC | PRN
  Administered 2015-10-01: 650 mg via ORAL
  Filled 2015-09-30: qty 2

## 2015-09-30 MED ORDER — CEFAZOLIN SODIUM-DEXTROSE 2-4 GM/100ML-% IV SOLN
2.0000 g | Freq: Three times a day (TID) | INTRAVENOUS | Status: AC
  Administered 2015-09-30 – 2015-10-01 (×2): 2 g via INTRAVENOUS
  Filled 2015-09-30 (×2): qty 100

## 2015-09-30 MED ORDER — ROCURONIUM BROMIDE 100 MG/10ML IV SOLN
INTRAVENOUS | Status: DC | PRN
  Administered 2015-09-30: 25 mg via INTRAVENOUS

## 2015-09-30 MED ORDER — POLYETHYLENE GLYCOL 3350 17 G PO PACK: 17.0000 g | PACK | Freq: Every day | ORAL | Status: DC | PRN

## 2015-09-30 MED ORDER — ROCURONIUM BROMIDE 50 MG/5ML IV SOLN
INTRAVENOUS | Status: AC
  Filled 2015-09-30: qty 1

## 2015-09-30 MED ORDER — METOCLOPRAMIDE HCL 5 MG PO TABS: 5.0000 mg | ORAL_TABLET | Freq: Three times a day (TID) | ORAL | Status: DC | PRN

## 2015-09-30 MED ORDER — PHENOL 1.4 % MT LIQD: 1.0000 | OROMUCOSAL | Status: DC | PRN

## 2015-09-30 MED ORDER — ONDANSETRON HCL 4 MG PO TABS: 4.0000 mg | ORAL_TABLET | Freq: Four times a day (QID) | ORAL | Status: DC | PRN

## 2015-09-30 MED ORDER — MENTHOL 3 MG MT LOZG: 1.0000 | LOZENGE | OROMUCOSAL | Status: DC | PRN

## 2015-09-30 MED ORDER — ENSURE ENLIVE PO LIQD
237.0000 mL | Freq: Two times a day (BID) | ORAL | Status: DC
  Administered 2015-10-01 – 2015-10-03 (×4): 237 mL via ORAL

## 2015-09-30 MED ORDER — METOCLOPRAMIDE HCL 5 MG/ML IJ SOLN: 5.0000 mg | Freq: Three times a day (TID) | INTRAMUSCULAR | Status: DC | PRN

## 2015-09-30 MED ORDER — PHENYLEPHRINE 40 MCG/ML (10ML) SYRINGE FOR IV PUSH (FOR BLOOD PRESSURE SUPPORT)
PREFILLED_SYRINGE | INTRAVENOUS | Status: AC
  Filled 2015-09-30: qty 10

## 2015-09-30 MED ORDER — PHENYLEPHRINE HCL 10 MG/ML IJ SOLN
10.0000 mg | INTRAVENOUS | Status: DC | PRN
  Administered 2015-09-30: 50 ug/min via INTRAVENOUS

## 2015-09-30 MED ORDER — PROPOFOL 1000 MG/100ML IV EMUL
INTRAVENOUS | Status: AC
  Filled 2015-09-30: qty 100

## 2015-09-30 MED ORDER — SENNA 8.6 MG PO TABS: 1.0000 | ORAL_TABLET | Freq: Two times a day (BID) | ORAL | Status: AC

## 2015-09-30 MED ORDER — FENTANYL CITRATE (PF) 100 MCG/2ML IJ SOLN
INTRAMUSCULAR | Status: DC | PRN
Start: 2015-09-30 — End: 2015-09-30
  Administered 2015-09-30: 50 ug via INTRAVENOUS
  Administered 2015-09-30: 25 ug via INTRAVENOUS

## 2015-09-30 MED ORDER — FENTANYL CITRATE (PF) 250 MCG/5ML IJ SOLN
INTRAMUSCULAR | Status: AC
  Filled 2015-09-30: qty 5

## 2015-09-30 MED ORDER — CEFAZOLIN SODIUM 1 G IJ SOLR
INTRAMUSCULAR | Status: AC
  Filled 2015-09-30: qty 20

## 2015-09-30 MED ORDER — PHENYLEPHRINE HCL 10 MG/ML IJ SOLN
INTRAMUSCULAR | Status: DC | PRN
  Administered 2015-09-30: 120 ug via INTRAVENOUS
  Administered 2015-09-30: 80 ug via INTRAVENOUS
  Administered 2015-09-30: 40 ug via INTRAVENOUS
  Administered 2015-09-30: 160 ug via INTRAVENOUS

## 2015-09-30 MED ORDER — DOCUSATE SODIUM 100 MG PO CAPS
100.0000 mg | ORAL_CAPSULE | Freq: Two times a day (BID) | ORAL | Status: DC
  Administered 2015-09-30 – 2015-10-03 (×4): 100 mg via ORAL
  Filled 2015-09-30 (×6): qty 1

## 2015-09-30 MED ORDER — PROPOFOL 10 MG/ML IV BOLUS
INTRAVENOUS | Status: AC
  Filled 2015-09-30: qty 20

## 2015-09-30 MED ORDER — BUPIVACAINE-EPINEPHRINE 0.5% -1:200000 IJ SOLN
INTRAMUSCULAR | Status: DC | PRN
  Administered 2015-09-30: 10 mL

## 2015-09-30 MED ORDER — SUGAMMADEX SODIUM 200 MG/2ML IV SOLN
INTRAVENOUS | Status: AC
  Filled 2015-09-30: qty 2

## 2015-09-30 MED ORDER — LIDOCAINE 2% (20 MG/ML) 5 ML SYRINGE
INTRAMUSCULAR | Status: AC
  Filled 2015-09-30: qty 5

## 2015-09-30 MED ORDER — BUPIVACAINE HCL (PF) 0.5 % IJ SOLN
INTRAMUSCULAR | Status: AC
  Filled 2015-09-30: qty 30

## 2015-09-30 MED ORDER — ASPIRIN EC 325 MG PO TBEC
325.0000 mg | DELAYED_RELEASE_TABLET | Freq: Every day | ORAL | Status: DC
  Administered 2015-10-01 – 2015-10-02 (×2): 325 mg via ORAL
  Filled 2015-09-30 (×2): qty 1

## 2015-09-30 MED ORDER — LACTATED RINGERS IV SOLN
INTRAVENOUS | Status: DC
  Administered 2015-09-30 (×2): via INTRAVENOUS

## 2015-09-30 SURGICAL SUPPLY — 49 items
BIT DRILL 4.3MMS DISTAL GRDTED (BIT) ×1 IMPLANT
BNDG COHESIVE 4X5 TAN STRL (GAUZE/BANDAGES/DRESSINGS) ×3 IMPLANT
BNDG COHESIVE 6X5 TAN STRL LF (GAUZE/BANDAGES/DRESSINGS) ×6 IMPLANT
CANISTER SUCTION 2500CC (MISCELLANEOUS) ×3 IMPLANT
COVER MAYO STAND STRL (DRAPES) ×3 IMPLANT
COVER PERINEAL POST (MISCELLANEOUS) ×3 IMPLANT
COVER SURGICAL LIGHT HANDLE (MISCELLANEOUS) ×3 IMPLANT
DRAPE C-ARM 42X72 X-RAY (DRAPES) ×3 IMPLANT
DRAPE STERI IOBAN 125X83 (DRAPES) ×3 IMPLANT
DRILL 4.3MMS DISTAL GRADUATED (BIT) ×3
DRSG PAD ABDOMINAL 8X10 ST (GAUZE/BANDAGES/DRESSINGS) ×3 IMPLANT
DURAPREP 26ML APPLICATOR (WOUND CARE) ×3 IMPLANT
ELECT REM PT RETURN 9FT ADLT (ELECTROSURGICAL) ×3
ELECTRODE REM PT RTRN 9FT ADLT (ELECTROSURGICAL) ×1 IMPLANT
EVACUATOR 1/8 PVC DRAIN (DRAIN) IMPLANT
GAUZE SPONGE 4X4 12PLY STRL (GAUZE/BANDAGES/DRESSINGS) ×3 IMPLANT
GAUZE XEROFORM 1X8 LF (GAUZE/BANDAGES/DRESSINGS) ×3 IMPLANT
GLOVE BIO SURGEON STRL SZ7 (GLOVE) ×3 IMPLANT
GLOVE BIOGEL PI IND STRL 7.0 (GLOVE) ×1 IMPLANT
GLOVE BIOGEL PI IND STRL 8 (GLOVE) ×2 IMPLANT
GLOVE BIOGEL PI INDICATOR 7.0 (GLOVE) ×2
GLOVE BIOGEL PI INDICATOR 8 (GLOVE) ×4
GLOVE ORTHO TXT STRL SZ7.5 (GLOVE) ×6 IMPLANT
GOWN STRL REUS W/ TWL LRG LVL3 (GOWN DISPOSABLE) ×1 IMPLANT
GOWN STRL REUS W/TWL 2XL LVL3 (GOWN DISPOSABLE) ×3 IMPLANT
GOWN STRL REUS W/TWL LRG LVL3 (GOWN DISPOSABLE) ×5 IMPLANT
GUIDEWIRE BALL NOSE 100CM (WIRE) ×3 IMPLANT
HIP FRA NAIL LAG SCREW 10.5X90 (Orthopedic Implant) ×3 IMPLANT
HIP FRAC NAIL LEFT 11X360MM (Orthopedic Implant) ×3 IMPLANT
KIT BASIN OR (CUSTOM PROCEDURE TRAY) ×3 IMPLANT
KIT ROOM TURNOVER OR (KITS) ×3 IMPLANT
LINER BOOT UNIVERSAL DISP (MISCELLANEOUS) ×3 IMPLANT
MANIFOLD NEPTUNE II (INSTRUMENTS) ×3 IMPLANT
NAIL HIP FRAC LEFT 11X360MM (Orthopedic Implant) ×1 IMPLANT
NEEDLE HYPO 25GX1X1/2 BEV (NEEDLE) ×3 IMPLANT
NS IRRIG 1000ML POUR BTL (IV SOLUTION) ×3 IMPLANT
PACK GENERAL/GYN (CUSTOM PROCEDURE TRAY) ×3 IMPLANT
PAD ARMBOARD 7.5X6 YLW CONV (MISCELLANEOUS) ×6 IMPLANT
SCREW BONE CORTICAL 5.0X42 (Screw) ×3 IMPLANT
SCREW LAG HIP FRA NAIL 10.5X90 (Orthopedic Implant) ×1 IMPLANT
SPONGE GAUZE 4X4 12PLY STER LF (GAUZE/BANDAGES/DRESSINGS) ×6 IMPLANT
STAPLER VISISTAT 35W (STAPLE) ×3 IMPLANT
SUT VIC AB 0 CT1 27 (SUTURE) ×2
SUT VIC AB 0 CT1 27XBRD ANBCTR (SUTURE) ×1 IMPLANT
SUT VIC AB 2-0 CT1 27 (SUTURE) ×4
SUT VIC AB 2-0 CT1 TAPERPNT 27 (SUTURE) ×2 IMPLANT
SYR CONTROL 10ML LL (SYRINGE) ×3 IMPLANT
TAPE CLOTH SURG 4X10 WHT LF (GAUZE/BANDAGES/DRESSINGS) ×6 IMPLANT
WATER STERILE IRR 1000ML POUR (IV SOLUTION) ×3 IMPLANT

## 2015-09-30 NOTE — H&P (View-Only) (Signed)
Reason for Consult:left IT hip fracture, closed Referring Physician: Charlies Silvers MD  Debra Flores is an 82 y.o. female.  HPI: 50 with dementia in SNF with fall and left IT hip fracture. Had fentanyl before transport and sleeping.   Past Medical History  Diagnosis Date  . Anxiety   . Thyroid disease   . Hypertension   . Depression, major ([place])   . Dementia     History reviewed. No pertinent past surgical history.  History reviewed. No pertinent family history.  Social History:  reports that she does not drink alcohol or use illicit drugs. Her tobacco history is not on file.  Allergies:  Allergies  Allergen Reactions  . Aricept [Donepezil Hcl]     Medications: I have reviewed the patient's current medications.  Results for orders placed or performed during the hospital encounter of 09/29/15 (from the past 48 hour(s))  Comprehensive metabolic panel     Status: Abnormal   Collection Time: 09/29/15  4:12 AM  Result Value Ref Range   Sodium 137 135 - 145 mmol/L   Potassium 3.4 (L) 3.5 - 5.1 mmol/L   Chloride 104 101 - 111 mmol/L   CO2 27 22 - 32 mmol/L   Glucose, Bld 107 (H) 65 - 99 mg/dL   BUN 32 (H) 6 - 20 mg/dL   Creatinine, Ser 0.77 0.44 - 1.00 mg/dL   Calcium 8.5 (L) 8.9 - 10.3 mg/dL   Total Protein 6.4 (L) 6.5 - 8.1 g/dL   Albumin 4.1 3.5 - 5.0 g/dL   AST 29 15 - 41 U/L   ALT 25 14 - 54 U/L   Alkaline Phosphatase 43 38 - 126 U/L   Total Bilirubin 1.3 (H) 0.3 - 1.2 mg/dL   GFR calc non Af Amer >60 >60 mL/min   GFR calc Af Amer >60 >60 mL/min    Comment: (NOTE) The eGFR has been calculated using the CKD EPI equation. This calculation has not been validated in all clinical situations. eGFR's persistently <60 mL/min signify possible Chronic Kidney Disease.    Anion gap 6 5 - 15  CBC with Differential     Status: None   Collection Time: 09/29/15  4:12 AM  Result Value Ref Range   WBC 7.2 4.0 - 10.5 K/uL   RBC 4.17 3.87 - 5.11 MIL/uL   Hemoglobin 13.1 12.0 - 15.0  g/dL   HCT 39.4 36.0 - 46.0 %   MCV 94.5 78.0 - 100.0 fL   MCH 31.4 26.0 - 34.0 pg   MCHC 33.2 30.0 - 36.0 g/dL   RDW 13.1 11.5 - 15.5 %   Platelets 163 150 - 400 K/uL   Neutrophils Relative % 76 %   Neutro Abs 5.5 1.7 - 7.7 K/uL   Lymphocytes Relative 17 %   Lymphs Abs 1.2 0.7 - 4.0 K/uL   Monocytes Relative 5 %   Monocytes Absolute 0.4 0.1 - 1.0 K/uL   Eosinophils Relative 2 %   Eosinophils Absolute 0.1 0.0 - 0.7 K/uL   Basophils Relative 0 %   Basophils Absolute 0.0 0.0 - 0.1 K/uL  Protime-INR     Status: None   Collection Time: 09/29/15  4:12 AM  Result Value Ref Range   Prothrombin Time 13.3 11.6 - 15.2 seconds   INR 0.99 0.00 - 1.49  APTT     Status: None   Collection Time: 09/29/15  4:12 AM  Result Value Ref Range   aPTT 31 24 - 37 seconds  Sample to  Blood Bank     Status: None   Collection Time: 09/29/15  4:12 AM  Result Value Ref Range   Blood Bank Specimen BBHLD    Sample Expiration 09/30/2015   TSH     Status: Abnormal   Collection Time: 09/29/15  4:12 AM  Result Value Ref Range   TSH 17.851 (H) 0.350 - 4.500 uIU/mL  Urinalysis, Routine w reflex microscopic (not at [facility])     Status: Abnormal   Collection Time: 09/29/15  4:40 AM  Result Value Ref Range   Color, Urine YELLOW YELLOW   APPearance CLOUDY (A) CLEAR   Specific Gravity, Urine 1.025 1.005 - 1.030   pH 6.0 5.0 - 8.0   Glucose, UA NEGATIVE NEGATIVE mg/dL   Hgb urine dipstick TRACE (A) NEGATIVE   Bilirubin Urine NEGATIVE NEGATIVE   Ketones, ur NEGATIVE NEGATIVE mg/dL   Protein, ur NEGATIVE NEGATIVE mg/dL   Nitrite POSITIVE (A) NEGATIVE   Leukocytes, UA MODERATE (A) NEGATIVE  Urine microscopic-add on     Status: Abnormal   Collection Time: 09/29/15  4:40 AM  Result Value Ref Range   Squamous Epithelial / LPF 0-5 (A) NONE SEEN   WBC, UA 6-30 0 - 5 WBC/hpf   RBC / HPF 0-5 0 - 5 RBC/hpf   Bacteria, UA MANY (A) NONE SEEN  CBC     Status: None   Collection Time: 09/29/15 11:42 AM  Result Value Ref  Range   WBC 6.9 4.0 - 10.5 K/uL   RBC 3.92 3.87 - 5.11 MIL/uL   Hemoglobin 12.1 12.0 - 15.0 g/dL   HCT 37.3 36.0 - 46.0 %   MCV 95.2 78.0 - 100.0 fL   MCH 30.9 26.0 - 34.0 pg   MCHC 32.4 30.0 - 36.0 g/dL   RDW 13.0 11.5 - 15.5 %   Platelets 165 150 - 400 K/uL    Dg Chest 1 View  09/29/2015  CLINICAL DATA:  Unwitnessed fall.  Hip fracture. EXAM: CHEST 1 VIEW COMPARISON:  None. FINDINGS: Mild cardiomegaly. There is tortuosity of the thoracic aorta with atherosclerosis. Mild coarse interstitial markings without evidence of pulmonary edema. No confluent airspace disease, pleural effusion or pneumothorax. Scoliotic curvature in degenerative change in the spine. No evidence of displaced rib fracture. IMPRESSION: Mild cardiomegaly and tortuous atherosclerotic thoracic aorta. No definite acute abnormality. Electronically Signed   By: Jeb Levering M.D.   On: 09/29/2015 04:01   Dg Pelvis 1-2 Views  09/29/2015  CLINICAL DATA:  82 year old post unwitnessed fall at nursing facility this morning. EXAM: PELVIS - 1-2 VIEW COMPARISON:  None. FINDINGS: Left subcapital femoral neck fracture with mild foreshortening. Right hip is grossly intact. Two weeks of the cysts and sacroiliac joints are congruent. No evidence of pelvic ring fracture. The bones are under mineralized. There is degenerative change of the lower lumbar spine. A stool ball distends the rectum. IMPRESSION: Mildly displaced left subcapital femoral neck fracture. Electronically Signed   By: Jeb Levering M.D.   On: 09/29/2015 03:16   Dg Hip Unilat With Pelvis 2-3 Views Left  09/29/2015  CLINICAL DATA:  Unwitnessed fall at nursing facility this morning. Hip fracture. EXAM: DG HIP (WITH OR WITHOUT PELVIS) 2-3V LEFT COMPARISON:  Pelvis radiograph earlier this day. FINDINGS: The left proximal femur fracture involves the intertrochanteric femur. The femoral neck is intact. There is mild displacement. Femoral head is seated in the acetabulum. No rami  fractures. IMPRESSION: Left proximal femur fracture involves the intertrochanteric femur, not the femoral neck  as reported on prior exam. The femoral neck appears intact. Electronically Signed   By: Jeb Levering M.D.   On: 09/29/2015 04:00    Review of Systems  Unable to perform ROS: dementia   Blood pressure 142/63, pulse 78, temperature 98.1 F (36.7 C), temperature source Oral, resp. rate 18, weight 45.36 kg (100 lb), SpO2 98 %. Physical Exam  Constitutional: She appears well-developed and well-nourished.  HENT:  Head: Normocephalic.  Eyes: Pupils are equal, round, and reactive to light.  Neck: Normal range of motion.  Cardiovascular: Normal rate.   Respiratory: Effort normal.  GI: Soft.  Musculoskeletal:  Pain with left hip ROM. Pulses normal  Neurological:  Confused , nonconversant  Skin: Skin is warm and dry.  Psychiatric:  dementia    Assessment/Plan: Left IT hip fracture. Will proceed with troch IM nail to fix fracture tomorrow. When son is here I can discuss it with him by phone.    My cell is  [phone].   Surgery Monday at about 11 AM  Jermiah Soderman C 09/29/2015, 12:08 PM

## 2015-09-30 NOTE — Interval H&P Note (Signed)
History and Physical Interval Note:  09/30/2015 10:50 AM  Debra Flores  has presented today for surgery, with the diagnosis of left hip fracture  The various methods of treatment have been discussed with the patient and family. After consideration of risks, benefits and other options for treatment, the patient has consented to  Procedure(s): INTRAMEDULLARY (IM) NAIL INTERTROCHANTRIC (Left) as a surgical intervention .  The patient's history has been reviewed, patient examined, no change in status, stable for surgery.  I have reviewed the patient's chart and labs.  Questions were answered to the patient's satisfaction.     [name]

## 2015-09-30 NOTE — OR Nursing (Signed)
Pt denies pain on arrival to pacu. Pts BP 194/93. Dr. Katrinka BlazingSmith informed of BP and no new orders.

## 2015-09-30 NOTE — Anesthesia Postprocedure Evaluation (Signed)
Anesthesia Post Note  Patient: Debra MainMargaret Guastella  Procedure(s) Performed: Procedure(s) (LRB): INTRAMEDULLARY (IM) NAIL INTERTROCHANTRIC (Left)  Patient location during evaluation: PACU Anesthesia Type: General Level of consciousness: awake and patient cooperative Pain management: pain level controlled Vital Signs Assessment: post-procedure vital signs reviewed and stable Respiratory status: spontaneous breathing and respiratory function stable Cardiovascular status: blood pressure returned to baseline and stable Anesthetic complications: no    Last Vitals:  Filed Vitals:   09/30/15 1245 09/30/15 1300  BP: 167/96 182/79  Pulse: 58 59  Temp:    Resp: 14 17    Last Pain:  Filed Vitals:   09/30/15 1301  PainSc: 0-No pain                 Anzel Kearse EDWARD

## 2015-09-30 NOTE — Discharge Instructions (Addendum)
° ° ° ° ° ° ° ° ° ° ° ° ° ° ° ° ° ° ° ° ° °  Hip FractureA hip fracture is a fracture of the upper part of your thigh bone (femur).  CAUSES A hip fracture is caused by a direct blow to the side of your hip. This is usually the result of a fall but can occur in other circumstances, such as an automobile accident. RISK FACTORS There is an increased risk of hip fractures in people with:  An unsteady walking pattern (gait) and those with conditions that contribute to poor balance, such as Parkinson's disease or dementia.  Osteopenia and osteoporosis.  Cancer that spreads to the leg bones.  Certain metabolic diseases. SYMPTOMS  Symptoms of hip fracture include:  Pain over the injured hip.  Inability to put weight on the leg in which the fracture occurred (although, some patients are able to walk after a hip fracture).  Toes and foot of the affected leg point outward when you lie down. DIAGNOSIS A physical exam can determine if a hip fracture is likely to have occurred. X-ray exams are needed to confirm the fracture and to look for other injuries. The X-ray exam can help to determine the type of hip fracture. Rarely, the fracture is not visible on an X-ray image and a CT scan or MRI will have to be done. TREATMENT  The treatment for a fracture is usually surgery. This means using a screw, nail, or rod to hold the bones in place.  HOME CARE INSTRUCTIONS Take all medicines as directed by your health care provider. SEEK MEDICAL CARE IF: Pain continues, even after taking pain medicine. MAKE SURE YOU:  Understand these instructions.   Will watch your condition.  Will get help right away if you are not doing well or get worse.   This information is not intended to replace advice given to you by your health care provider. Make sure you discuss any questions you have with your health care provider.   Document Released: 03/09/2005 Document Revised: 03/14/2013 Document Reviewed:  10/19/2012 Elsevier Interactive Patient Education 2016 [employer]   ORTHOPEDIC DISCHARGE INSTRUCTION -ok to shower -weight bear as tolerated with walker and assistance.  -scripts on chart for norco (pain) and aspirin (DVT prophylaxis) -daily dressing changes with gauze and tape.  Do not apply any creams or ointments to incisions.  -need return office visit with Dr Ophelia CharterYates 2 weeks postop.

## 2015-09-30 NOTE — Anesthesia Procedure Notes (Signed)
Procedure Name: Intubation Date/Time: 09/30/2015 11:19 AM Performed by: Army FossaPULLIAM, Stevie Ertle DANE Pre-anesthesia Checklist: Patient identified, Emergency Drugs available, Suction available, Patient being monitored and Timeout performed Patient Re-evaluated:Patient Re-evaluated prior to inductionOxygen Delivery Method: Circle system utilized Preoxygenation: Pre-oxygenation with 100% oxygen Intubation Type: IV induction Ventilation: Mask ventilation without difficulty Laryngoscope Size: Mac and 3 Grade View: Grade I Tube type: Oral Tube size: 6.5 mm Number of attempts: 1 Airway Equipment and Method: Patient positioned with wedge pillow and Stylet Placement Confirmation: ETT inserted through vocal cords under direct vision,  positive ETCO2,  CO2 detector and breath sounds checked- equal and bilateral Secured at: 22 cm Tube secured with: Tape Dental Injury: Teeth and Oropharynx as per pre-operative assessment

## 2015-09-30 NOTE — Progress Notes (Addendum)
Patient ID: Debra Flores, female   DOB: 01-26-1923, 82 y.o.   MRN: [id number]  PROGRESS NOTE    Debra Flores  WUJ:[id number] DOB: 01-26-1923 DOA: 09/29/2015  PCP: No primary care provider on file. MD in SNF   Brief Narrative:  82 y.o. female with past medical history significant for advanced dementia (from memory care unit), hypertension, depression who [facility] status post fall. She sustained left femoral neck fracture. She is s/p intramedullary nail 09/30/2015.   Assessment & Plan:   Principal Problem:   Intertrochanteric fracture of left femur (HCC) - Status post fall - S/p intramedullary nail 7/10 by Dr. Ophelia CharterYates - No subsequent complications - Check CBC in am  Active Problems:   Anxiety and depression - Continue lexapro    Hypothyroidism - Continue synthroid    Dementia - Stable - From memory care unit    Benign essential HTN - Continue atenolol    Hypokalemia - Subsequently resolved     DVT prophylaxis: aspirin and SCD's bilaterally  Code Status: DNR/DNI Family Communication: called son over phone [phone] Disposition Plan: to SNF [facility]([place]) likely in am   Consultants:   Dr. Ophelia CharterYates, orthopedic surgery   Procedures:   Intramedullary nail 09/30/2015  Antimicrobials:   Rocephin 09/29/2015   Cefazolin 09/30/2015 -->   Subjective: No acute events.   Objective: Filed Vitals:   09/30/15 1330 09/30/15 1332 09/30/15 1345 09/30/15 1504  BP:  162/69  142/108  Pulse: 59 58 60 95  Temp:   98.1 F (36.7 C) 97.3 F (36.3 C)  TempSrc:    Axillary  Resp: 16 14 14 16   Weight:      SpO2: 99% 98% 97% 94%    Intake/Output Summary (Last 24 hours) at 09/30/15 1910 Last data filed at 09/30/15 1208  Gross per 24 hour  Intake    800 ml  Output    700 ml  Net    100 ml   Filed Weights   09/29/15 0218  Weight: 45.36 kg (100 lb)    Examination:  General exam: Appears calm and comfortable  Respiratory system: Clear to auscultation.  Respiratory effort normal. Cardiovascular system: S1 & S2 heard, RRR. Gastrointestinal system: Abdomen is nondistended, soft and nontender. No organomegaly or masses felt. Normal bowel sounds heard. Central nervous system: No focal neurological deficits. Extremities: No edema, palpable pulses  Skin: No rashes, lesions or ulcers Psychiatry: Normal mood and behavior   Data Reviewed: I have personally reviewed following labs and imaging studies  CBC:  Recent Labs Lab 09/29/15 0412 09/29/15 1142 09/30/15 0322  WBC 7.2 6.9 5.9  NEUTROABS 5.5  --   --   HGB 13.1 12.1 12.2  HCT 39.4 37.3 38.2  MCV 94.5 95.2 94.8  PLT 163 165 175   Basic Metabolic Panel:  Recent Labs Lab 09/29/15 0412 09/29/15 1142 09/30/15 0322  NA 137  --  139  K 3.4*  --  4.0  CL 104  --  108  CO2 27  --  24  GLUCOSE 107*  --  96  BUN 32*  --  18  CREATININE 0.77 0.70 0.76  CALCIUM 8.5*  --  8.9   GFR: CrCl cannot be calculated (Unknown ideal weight.). Liver Function Tests:  Recent Labs Lab 09/29/15 0412  AST 29  ALT 25  ALKPHOS 43  BILITOT 1.3*  PROT 6.4*  ALBUMIN 4.1   No results for input(s): LIPASE, AMYLASE in the last 168 hours. No results for input(s):  AMMONIA in the last 168 hours. Coagulation Profile:  Recent Labs Lab 09/29/15 0412  INR 0.99   Cardiac Enzymes: No results for input(s): CKTOTAL, CKMB, CKMBINDEX, TROPONINI in the last 168 hours. BNP (last 3 results) No results for input(s): PROBNP in the last 8760 hours. HbA1C: No results for input(s): HGBA1C in the last 72 hours. CBG: No results for input(s): GLUCAP in the last 168 hours. Lipid Profile: No results for input(s): CHOL, HDL, LDLCALC, TRIG, CHOLHDL, LDLDIRECT in the last 72 hours. Thyroid Function Tests:  Recent Labs  09/29/15 0412  TSH 17.851*   Anemia Panel: No results for input(s): VITAMINB12, FOLATE, FERRITIN, TIBC, IRON, RETICCTPCT in the last 72 hours. Urine analysis:    Component Value Date/Time     COLORURINE YELLOW 09/29/2015 0440   APPEARANCEUR CLOUDY* 09/29/2015 0440   LABSPEC 1.025 09/29/2015 0440   PHURINE 6.0 09/29/2015 0440   GLUCOSEU NEGATIVE 09/29/2015 0440   HGBUR TRACE* 09/29/2015 0440   BILIRUBINUR NEGATIVE 09/29/2015 0440   KETONESUR NEGATIVE 09/29/2015 0440   PROTEINUR NEGATIVE 09/29/2015 0440   NITRITE POSITIVE* 09/29/2015 0440   LEUKOCYTESUR MODERATE* 09/29/2015 0440   Sepsis Labs: (procalcitonin:4,lacticidven:4)    MRSA PCR Screening     Status: None   Collection Time: 09/29/15  8:49 PM  Result Value Ref Range Status   MRSA by PCR NEGATIVE NEGATIVE Final      Radiology Studies: Dg Chest 1 View 09/29/2015   Mild cardiomegaly and tortuous atherosclerotic thoracic aorta. No definite acute abnormality. Electronically Signed   By: Rubye Oaks M.D.   On: 09/29/2015 04:01   Dg Pelvis 1-2 Views 09/29/2015   Mildly displaced left subcapital femoral neck fracture. Electronically Signed   By: Rubye Oaks M.D.   On: 09/29/2015 03:16   Dg C-arm 1-60 Min 09/30/2015   Post ORIF LEFT femur. Electronically Signed   By: Ulyses Southward M.D.   On: 09/30/2015 12:22   Dg Hip Unilat With Pelvis 2-3 Views Left 09/29/2015   Left proximal femur fracture involves the intertrochanteric femur, not the femoral neck as reported on prior exam. The femoral neck appears intact. Electronically Signed   By: Rubye Oaks M.D.   On: 09/29/2015 04:00   Dg Femur Min 2 Views Left 09/30/2015  Post ORIF LEFT femur. Electronically Signed   By: Ulyses Southward M.D.   On: 09/30/2015 12:22     Scheduled Meds: . [START ON 10/01/2015] aspirin EC  325 mg Oral Q breakfast  . atenolol  12.5 mg Oral Daily  . busPIRone  15 mg Oral BID  .  ceFAZolin (ANCEF) IV  2 g Intravenous Q8H  . docusate sodium  100 mg Oral BID  . escitalopram  15 mg Oral Daily  . [START ON 10/01/2015] feeding supplement (ENSURE ENLIVE)  237 mL Oral BID BM  . levothyroxine  75 mcg Oral QAC breakfast  . senna  1  tablet Oral BID   Continuous Infusions: . lactated ringers 50 mL/hr at 09/30/15 1038     LOS: 1 day    Time spent: 25 minutes  Greater than 50% of the time spent on counseling and coordinating the care.   Manson Passey, MD [facility] Pager [phone]  If 7PM-7AM, please contact night-coverage [email or web address] Password [email or web address] 09/30/2015, 7:10 PM

## 2015-09-30 NOTE — Progress Notes (Signed)
Initial Nutrition Assessment  DOCUMENTATION CODES:   Not applicable  INTERVENTION:  Once diet advances, provide Ensure Enlive po BID, each supplement provides 350 kcal and 20 grams of protein.  Recommend obtaining height measurement.   RD to continue to monitor.   NUTRITION DIAGNOSIS:   Increased nutrient needs related to  (s/p surgery) as evidenced by estimated needs; ongoing  GOAL:   Patient will meet greater than or equal to 90% of their needs; not met  MONITOR:   Supplement acceptance, Weight trends, Labs, I & O's, Diet advancement  REASON FOR ASSESSMENT:   Consult Hip fracture protocol  ASSESSMENT:   82 y.o. female with medical history significant of advanced dementia. She was brought in by EMS after falling at the SNF where she lives. She c/o pain in her right hip with movement, per Dr. Johnsie Kindred note. The patient is quite tangential and unable to answer any questions appropriately.  Procedure (7/10): INTRAMEDULLARY (IM) NAIL INTERTROCHANTRIC (Left)  Pt was in OR during attempted time of visit. RD unable to obtain nutrition history. Unable to complete Nutrition-Focused physical exam at this time. RD to perform at next visit. RD to order Ensure to aid in caloric and protein needs. Nursing staff to provide once diet advances. Noted SLP to evaluate pt's swallowing tomorrow.   Labs and medications reviewed.   Diet Order:  Diet NPO time specified  Skin:   (Incision on L hip)  Last BM:  Unknown  Height:   Ht Readings from Last 1 Encounters:  No data found for Ht   Weight:   Wt Readings from Last 1 Encounters:  09/29/15 100 lb (45.36 kg)    Ideal Body Weight:     BMI:  There is no height on file to calculate BMI.  Estimated Nutritional Needs:   Kcal:  1250-1500  Protein:  50-65 grams  Fluid:  >/= 1.5 L/day  EDUCATION NEEDS:   No education needs identified at this time  Corrin Parker, MS, RD, LDN Pager # [phone] After hours/ weekend pager  # [phone]

## 2015-09-30 NOTE — Care Management Important Message (Signed)
Important Message  Patient Details  Name: Debra Flores MRN: [id number] Date of Birth: June 10, 1922   Medicare Important Message Given:  Yes    [name], Swara Donze Coleman 09/30/2015, 9:42 AM

## 2015-09-30 NOTE — Anesthesia Preprocedure Evaluation (Signed)
Anesthesia Evaluation  Patient identified by MRN, date of birth, ID band Patient awake    Reviewed: Allergy & Precautions, H&P , NPO status , Patient's Chart, lab work & pertinent test results, reviewed documented beta blocker date and time   Airway Mallampati: II  TM Distance: >3 FB Neck ROM: full    Dental no notable dental hx. (+) Dental Advisory Given, Teeth Intact   Pulmonary neg pulmonary ROS,    Pulmonary exam normal breath sounds clear to auscultation       Cardiovascular Exercise Tolerance: Good hypertension, Pt. on home beta blockers Normal cardiovascular exam Rhythm:regular Rate:Normal     Neuro/Psych Anxiety Depression dementia negative neurological ROS  negative psych ROS   GI/Hepatic negative GI ROS, Neg liver ROS,   Endo/Other  negative endocrine ROSHypothyroidism   Renal/GU negative Renal ROS  negative genitourinary   Musculoskeletal   Abdominal   Peds  Hematology negative hematology ROS (+)   Anesthesia Other Findings   Reproductive/Obstetrics negative OB ROS                             Anesthesia Physical Anesthesia Plan  ASA: III  Anesthesia Plan: General   Post-op Pain Management:    Induction: Intravenous  Airway Management Planned: Oral ETT  Additional Equipment:   Intra-op Plan:   Post-operative Plan: Extubation in OR  Informed Consent: I have reviewed the patients History and Physical, chart, labs and discussed the procedure including the risks, benefits and alternatives for the proposed anesthesia with the patient or authorized representative who has indicated his/her understanding and acceptance.   Dental Advisory Given  Plan Discussed with: CRNA  Anesthesia Plan Comments:         Anesthesia Quick Evaluation

## 2015-09-30 NOTE — Brief Op Note (Signed)
09/29/2015 - 09/30/2015  12:28 PM  PATIENT:  Debra Flores  82 y.o. female  PRE-OPERATIVE DIAGNOSIS:  left hip fracture  POST-OPERATIVE DIAGNOSIS:  left hip fracture  PROCEDURE:  Procedure(s): INTRAMEDULLARY (IM) NAIL INTERTROCHANTRIC (Left)  SURGEON:  Surgeon(s) and Role:    * Eldred MangesMark C Yates, MD - Primary  PHYSICIAN ASSISTANT: none    ANESTHESIA:   general  EBL:  Total I/O In: 800 [I.V.:800] Out: 700 [Urine:600; Blood:100]  BLOOD ADMINISTERED:none  DRAINS: none    SPECIMEN:  No Specimen  DISPOSITION OF SPECIMEN:  N/A  COUNTS:  YES  TOURNIQUET:  * No tourniquets in log *  DICTATION: .Dragon Dictation PATIENT DISPOSITION:  PACU - hemodynamically stable.

## 2015-09-30 NOTE — Progress Notes (Signed)
SLP Cancellation Note  Patient Details Name: Debra Flores MRN: [id number] DOB: July 05, 1922   Cancelled treatment:       Reason Eval/Treat Not Completed: Patient at procedure or test/unavailable. Pt will have hip surgery today and should remain NPO. SLP will f/u tomorrow for swallow eval.   Harlon DittyBonnie Zamani Crocker, MA CCC-SLP [phone]  Claudine MoutonDeBlois, Arlana Canizales Caroline 09/30/2015, 8:57 AM

## 2015-09-30 NOTE — Transfer of Care (Signed)
Immediate Anesthesia Transfer of Care Note  Patient: Debra Flores  Procedure(s) Performed: Procedure(s): INTRAMEDULLARY (IM) NAIL INTERTROCHANTRIC (Left)  Patient Location: PACU  Anesthesia Type:General  Level of Consciousness:  sedated, patient cooperative and responds to stimulation  Airway & Oxygen Therapy:Patient Spontanous Breathing and Patient connected to face mask oxgen  Post-op Assessment:  Report given to PACU RN and Post -op Vital signs reviewed.  Dr. Katrinka BlazingSmith made aware of systolic BP greater than 190.  Dr. Katrinka BlazingSmith ordered to continue to monitor BP and to not treat BP with IV antihypertensives.   Post vital signs:  Reviewed and stable  Last Vitals:  Filed Vitals:   09/30/15 0417 09/30/15 1208  BP: 164/67 194/93  Pulse: 95 65  Temp: 36.3 C 36.5 C  Resp: 16 18    Complications: No apparent anesthesia complications

## 2015-09-30 NOTE — Op Note (Signed)
NAMAnnie Main:  Debra Flores, Debra Flores             ACCOUNT NO.:  [id number]  MEDICAL RECORD NO.:  [id number]  LOCATION:  [id number]                        FACILITY:  [facility]  PHYSICIAN:  Tahnee Cifuentes C. Ophelia CharterYates, M.D.    DATE OF BIRTH:  1922/11/04  DATE OF PROCEDURE:  09/30/2015 DATE OF DISCHARGE:  09/30/2015                              OPERATIVE REPORT   PREOPERATIVE DIAGNOSIS:  Left closed intertrochanteric fracture.  POSTOPERATIVE DIAGNOSIS:  Left closed intertrochanteric fracture.  PROCEDURE:  Left intramedullary hip screw.  Biomet AFFIXUS 11 x 340 with 90-mm lag screw, 42-mm distal interlock.  SURGEON:  Kirtan Sada C. Ophelia CharterYates, M.D.  ANESTHESIA:  General plus 10 mL Marcaine local.  ESTIMATED BLOOD LOSS:  Less than 100 mL.  DRAINS:  None.  A [age] year old female with dementia, fell with intertrochanteric fracture.  Family members gave consent and she was placed on the fracture table, prepping and draping, C-arm, visualization of the fracture, AP and lateral.  DuraPrep, Ancef prophylaxis.  Time-out procedure completed.  Incision was made laterally.  Gluteus medius was split, tip of the trochanter was palpated, reamed and the nail was inserted after measurements for 360, initially backed off to a 340 nail. The nail was just countersunk and pin was placed up low in the neck on AP, center in the head on lateral.  Nail was inserted and the compression screw was inserted and locked.  Distal interlock was done with a freehand technique.  Final spot pictures were taken showing good position and alignment.  There was slight comminution at the trochanter. Irrigation with saline solution.  Closure with 0 Vicryl in the deep fascia, 2-0 in the subcutaneous tissue.  Skin with staple closure, Marcaine infiltration of the skin and postop dressing.     Glenys Snader C. Ophelia CharterYates, M.D.     MCY/MEDQ  D:  09/30/2015  T:  09/30/2015  Job:  [id number]

## 2015-10-01 DIAGNOSIS — E440 Moderate protein-calorie malnutrition: Secondary | ICD-10-CM | POA: Insufficient documentation

## 2015-10-01 DIAGNOSIS — E038 Other specified hypothyroidism: Secondary | ICD-10-CM

## 2015-10-01 LAB — CBC
HEMATOCRIT: 29.5 % — AB (ref 36.0–46.0)
HEMOGLOBIN: 9.7 g/dL — AB (ref 12.0–15.0)
MCH: 31.1 pg (ref 26.0–34.0)
MCHC: 32.9 g/dL (ref 30.0–36.0)
MCV: 94.6 fL (ref 78.0–100.0)
Platelets: 163 10*3/uL (ref 150–400)
RBC: 3.12 MIL/uL — AB (ref 3.87–5.11)
RDW: 13.3 % (ref 11.5–15.5)
WBC: 9.7 10*3/uL (ref 4.0–10.5)

## 2015-10-01 LAB — BASIC METABOLIC PANEL
Anion gap: 9 (ref 5–15)
BUN: 29 mg/dL — AB (ref 6–20)
CALCIUM: 8.4 mg/dL — AB (ref 8.9–10.3)
CO2: 23 mmol/L (ref 22–32)
CREATININE: 1.11 mg/dL — AB (ref 0.44–1.00)
Chloride: 105 mmol/L (ref 101–111)
GFR calc non Af Amer: 42 mL/min — ABNORMAL LOW (ref >60)
GFR, EST AFRICAN AMERICAN: 49 mL/min — AB (ref >60)
Glucose, Bld: 107 mg/dL — ABNORMAL HIGH (ref 65–99)
Potassium: 4.1 mmol/L (ref 3.5–5.1)
SODIUM: 137 mmol/L (ref 135–145)

## 2015-10-01 NOTE — Clinical Social Work Note (Signed)
LCSW attempted to speak with patient's son, Lloyd Hugereil, regarding discharge. LCSW left message with patient's son and awaits return call.  Marcelline Deistmily Windy Dudek, LCSW [phone] Orthopedics: [phone] Surgical: [phone]

## 2015-10-01 NOTE — Evaluation (Signed)
Physical Therapy Evaluation Patient Details Name: Debra Flores MRN: [id number] DOB: 1922/09/29 Today's Date: 10/01/2015   History of Present Illness  Pt is a 82 y/o female s/p L femoral fx, post-op intramedullary nail. PMH including but not limited to dementia and HTN.  Clinical Impression  Pt presented supine in bed with HOB elevated and LEs elevated. Pt with cognitive deficits at baseline and therefore with difficulty fully participating in PT evaluation. Pt required total assist of two for bed mobility and transfers. Pt would continue to benefit from skilled physical therapy services at this time while inpatient and after d/c. PT recommending d/c to SNF.      Follow Up Recommendations SNF    Equipment Recommendations  None recommended by PT    Recommendations for Other Services       Precautions / Restrictions Precautions Precautions: None Restrictions Weight Bearing Restrictions: Yes LLE Weight Bearing: Weight bearing as tolerated      Mobility  Bed Mobility Overal bed mobility: +2 for physical assistance;Needs Assistance Bed Mobility: Supine to Sit     Supine to sit: Total assist;+2 for physical assistance     General bed mobility comments: utilized pad underneath pt to assist with transfer  Transfers Overall transfer level: Needs assistance Equipment used: 2 person hand held assist Transfers: Stand Pivot Transfers   Stand pivot transfers: Total assist;+2 physical assistance       General transfer comment: transferred pt towards her R side  Ambulation/Gait                Stairs            Wheelchair Mobility    Modified Rankin (Stroke Patients Only)       Balance Overall balance assessment: Needs assistance Sitting-balance support: Bilateral upper extremity supported Sitting balance-Leahy Scale: Poor   Postural control: Posterior lean   Standing balance-Leahy Scale: Zero                               Pertinent  Vitals/Pain Pain Assessment: Faces Faces Pain Scale: Hurts even more Pain Location: L hip Pain Descriptors / Indicators: Discomfort Pain Intervention(s): Monitored during session;Repositioned;Ice applied    Home Living Family/patient expects to be discharged to:: Unsure                 Additional Comments: Pt with cognitive deficits at baseline and no caregivers present to provide information.    Prior Function Level of Independence:  (unsure - pt with cognitive deficits at baseline)               Hand Dominance        Extremity/Trunk Assessment   Upper Extremity Assessment: Defer to OT evaluation           Lower Extremity Assessment: LLE deficits/detail   LLE Deficits / Details: Pt with decreased strength and ROM limitations secondary to post-op.     Communication   Communication: Receptive difficulties;Expressive difficulties  Cognition Arousal/Alertness: Awake/alert Behavior During Therapy: WFL for tasks assessed/performed Overall Cognitive Status: History of cognitive impairments - at baseline                      General Comments      Exercises        Assessment/Plan    PT Assessment Patient needs continued PT services  PT Diagnosis Difficulty walking   PT Problem List Decreased strength;Decreased range of motion;Decreased activity  tolerance;Decreased balance;Decreased mobility;Decreased coordination;Decreased knowledge of use of DME;Decreased safety awareness;Pain  PT Treatment Interventions DME instruction;Gait training;Functional mobility training;Therapeutic activities;Therapeutic exercise;Balance training;Patient/family education   PT Goals (Current goals can be found in the Care Plan section) Acute Rehab PT Goals Patient Stated Goal: Pt unable to state goals secondary to cognitive deficits (at baseline). PT Goal Formulation: Patient unable to participate in goal setting Time For Goal Achievement: 10/08/15 Potential to Achieve  Goals: Fair    Frequency Min 3X/week   Barriers to discharge        Co-evaluation               End of Session Equipment Utilized During Treatment: Gait belt Activity Tolerance: Patient limited by pain Patient left: in chair;with call bell/phone within reach;with chair alarm set Nurse Communication: Mobility status         Time: [id number] PT Time Calculation (min) (ACUTE ONLY): 23 min   Charges:   PT Evaluation $PT Eval Moderate Complexity: 1 Procedure     PT G CodesAlessandra Bevels:        Seng Larch M Kinley Dozier 10/01/2015, 12:42 PM  Deborah ChalkJennifer Alysa Duca, PT, DPT [phone]

## 2015-10-01 NOTE — Clinical Social Work Placement (Signed)
   CLINICAL SOCIAL WORK PLACEMENT  NOTE  Date:  10/01/2015  Patient Details  Name: Debra Flores MRN: [id number] Date of Birth: [birth year]  Clinical Social Work is seeking post-discharge placement for this patient at the Skilled  Nursing Facility level of care (*CSW will initial, date and re-position this form in  chart as items are completed):  Yes   Patient/family provided with [place] Clinical Social Work Department's list of facilities offering this level of care within the geographic area requested by the patient (or if unable, by the patient's family).  Yes   Patient/family informed of their freedom to choose among providers that offer the needed level of care, that participate in Medicare, Medicaid or managed care program needed by the patient, have an available bed and are willing to accept the patient.  Yes   Patient/family informed of [place]'s ownership interest [facility] and [facility], [facility], as well as of the fact that they are under no obligation to receive care at these facilities.  PASRR submitted to EDS on 10/01/15     PASRR number received on 10/01/15     Existing PASRR number confirmed on       FL2 transmitted to all facilities in geographic area requested by pt/family on 10/01/15     FL2 transmitted to all facilities within larger geographic area on       Patient informed that his/her managed care company has contracts with or will negotiate with certain facilities, including the following:            Patient/family informed of bed offers received.  Patient chooses bed at       Physician recommends and patient chooses bed at      Patient to be transferred to   on  .  Patient to be transferred to facility by       Patient family notified on   of transfer.  Name of family member notified:        PHYSICIAN Please sign FL2     Additional Comment:    _______________________________________________ Rod MaeVaughn, Shyhiem Beeney S, LCSW 10/01/2015,  3:21 PM

## 2015-10-01 NOTE — Progress Notes (Signed)
Subjective: 1 Day Post-Op Procedure(s) (LRB): INTRAMEDULLARY (IM) NAIL INTERTROCHANTRIC (Left) Patient reports pain as mild.    Objective: Vital signs in last 24 hours: Temp:  [97.3 F (36.3 C)-98.1 F (36.7 C)] 98 F (36.7 C) (07/11 0530) Pulse Rate:  [58-103] 80 (07/11 0530) Resp:  [13-18] 15 (07/11 0530) BP: (109-194)/(48-108) 109/48 mmHg (07/11 0530) SpO2:  [91 %-100 %] 93 % (07/11 0530)  Intake/Output from previous day: 07/10 0701 - 07/11 0700 In: 1743.3 [I.V.:1743.3] Out: 950 [Urine:850; Blood:100] Intake/Output this shift:     Recent Labs  09/29/15 0412 09/29/15 1142 09/30/15 0322 10/01/15 0431  HGB 13.1 12.1 12.2 9.7*    Recent Labs  09/30/15 0322 10/01/15 0431  WBC 5.9 9.7  RBC 4.03 3.12*  HCT 38.2 29.5*  PLT 175 163    Recent Labs  09/30/15 0322 10/01/15 0431  NA 139 137  K 4.0 4.1  CL 108 105  CO2 24 23  BUN 18 29*  CREATININE 0.76 1.11*  GLUCOSE 96 107*  CALCIUM 8.9 8.4*    Recent Labs  09/29/15 0412  INR 0.99    Neurologically intact  Assessment/Plan: 1 Day Post-Op Procedure(s) (LRB): INTRAMEDULLARY (IM) NAIL INTERTROCHANTRIC (Left) Up with therapy, SNF  Leelynd Maldonado C 10/01/2015, 7:51 AM

## 2015-10-01 NOTE — [place] FL2 (Signed)
[place] MEDICAID FL2 LEVEL OF CARE SCREENING TOOL     IDENTIFICATION  Patient Name: Debra Flores Birthdate: June 09, 1922 Sex: female Admission Date (Current Location): 09/29/2015  [facility] [place]   Facility and Address:  [facility], [address], [place], [place] [address]      Provider Number: [id number]  Attending Physician Name and Address:  Alison MurrayAlma M Devine, MD  Relative Name and Phone Number:       Current Level of Care: Hospital Recommended Level of Care: Skilled Nursing Facility Prior Approval Number:    Date Approved/Denied:   PASRR Number: [id number]  Discharge Plan: SNF    Current Diagnoses: Patient Active Problem List   Diagnosis Date Noted  . Intertrochanteric fracture of left femur (HCC) 09/29/2015  . Anxiety and depression 09/29/2015  . Hypothyroidism 09/29/2015  . Dementia 09/29/2015  . Benign essential HTN 09/29/2015  . Hip fracture requiring operative repair (HCC) 09/29/2015  . Hypokalemia 09/29/2015    Orientation RESPIRATION BLADDER Height & Weight     Self  Normal Incontinent Weight: 100 lb (45.36 kg) Height:     BEHAVIORAL SYMPTOMS/MOOD NEUROLOGICAL BOWEL NUTRITION STATUS      Continent Diet (Please see discharge summary.)  AMBULATORY STATUS COMMUNICATION OF NEEDS Skin    (Has not ambulated with PT.) Verbally Surgical wounds                       Personal Care Assistance Level of Assistance  Bathing, Feeding, Dressing Bathing Assistance: Maximum assistance Feeding assistance: Limited assistance Dressing Assistance: Maximum assistance     Functional Limitations Info             SPECIAL CARE FACTORS FREQUENCY  PT (By licensed PT), OT (By licensed OT)     PT Frequency: 5 OT Frequency: 5            Contractures      Additional Factors Info  Code Status, Allergies Code Status Info: Aricept Allergies Info: DNR           Current Medications (10/01/2015):  This is  the current hospital active medication list Current Facility-Administered Medications  Medication Dose Route Frequency Provider Last Rate Last Dose  . acetaminophen (TYLENOL) tablet 650 mg  650 mg Oral Q6H PRN Naida SleightJames M Owens, PA-C       Or  . acetaminophen (TYLENOL) suppository 650 mg  650 mg Rectal Q6H PRN Naida SleightJames M Owens, PA-C      . aspirin EC tablet 325 mg  325 mg Oral Q breakfast Naida SleightJames M Owens, PA-C   325 mg at 10/01/15 1026  . atenolol (TENORMIN) tablet 12.5 mg  12.5 mg Oral Daily Jonah BlueJennifer Yates, MD   12.5 mg at 09/30/15 0840  . busPIRone (BUSPAR) tablet 15 mg  15 mg Oral BID Jonah BlueJennifer Yates, MD   15 mg at 10/01/15 1026  . docusate sodium (COLACE) capsule 100 mg  100 mg Oral BID Naida SleightJames M Owens, PA-C   100 mg at 09/30/15 2105  . escitalopram (LEXAPRO) tablet 15 mg  15 mg Oral Daily Jonah BlueJennifer Yates, MD   15 mg at 10/01/15 1026  . feeding supplement (ENSURE ENLIVE) (ENSURE ENLIVE) liquid 237 mL  237 mL Oral BID BM Alison MurrayAlma M Devine, MD   237 mL at 10/01/15 1302  . HYDROcodone-acetaminophen (NORCO/VICODIN) 5-325 MG per tablet 1-2 tablet  1-2 tablet Oral Q6H PRN Jonah BlueJennifer Yates, MD   2 tablet at 09/30/15 1535  .  lactated ringers infusion   Intravenous Continuous Ronelle Nigh, MD 50 mL/hr at 09/30/15 2121    . levothyroxine (SYNTHROID, LEVOTHROID) tablet 75 mcg  75 mcg Oral QAC breakfast Jonah Blue, MD   75 mcg at 10/01/15 1026  . menthol-cetylpyridinium (CEPACOL) lozenge 3 mg  1 lozenge Oral PRN Naida Sleight, PA-C       Or  . phenol (CHLORASEPTIC) mouth spray 1 spray  1 spray Mouth/Throat PRN Naida Sleight, PA-C      . metoCLOPramide (REGLAN) tablet 5-10 mg  5-10 mg Oral Q8H PRN Naida Sleight, PA-C       Or  . metoCLOPramide (REGLAN) injection 5-10 mg  5-10 mg Intravenous Q8H PRN Naida Sleight, PA-C      . morphine 2 MG/ML injection 0.5 mg  0.5 mg Intravenous Q2H PRN Jonah Blue, MD   0.5 mg at 10/01/15 1027  . ondansetron (ZOFRAN) tablet 4 mg  4 mg Oral Q6H PRN Naida Sleight, PA-C       Or  .  ondansetron [facility]) injection 4 mg  4 mg Intravenous Q6H PRN Naida Sleight, PA-C   4 mg at 09/30/15 2117  . polyethylene glycol (MIRALAX / GLYCOLAX) packet 17 g  17 g Oral Daily PRN Naida Sleight, PA-C      . senna [facility]) tablet 8.6 mg  1 tablet Oral BID Jonah Blue, MD   8.6 mg at 10/01/15 1026     Discharge Medications: Please see discharge summary for a list of discharge medications.  Relevant Imaging Results:  Relevant Lab Results:   Additional Information SSN: [id number]  Debra Mae, LCSW

## 2015-10-01 NOTE — Progress Notes (Signed)
Nutrition Follow-up  DOCUMENTATION CODES:   Non-severe (moderate) malnutrition in context of chronic illness  INTERVENTION:  Continue Ensure Enlive po BID, each supplement provides 350 kcal and 20 grams of protein.  Recommend obtaining height measurement.   Encourage adequate PO intake.   NUTRITION DIAGNOSIS:   Increased nutrient needs related to  (s/p surgery) as evidenced by estimated needs; ongoing  GOAL:   Patient will meet greater than or equal to 90% of their needs; progressing  MONITOR:   PO intake, Supplement acceptance, Weight trends, Labs, I & O's, Skin  REASON FOR ASSESSMENT:   Consult Hip fracture protocol  ASSESSMENT:   82 y.o. female with medical history significant of advanced dementia. She was brought in by EMS after falling at the SNF where she lives. She c/o pain in her right hip with movement, per Dr. Delford FieldKnapp's note. The patient is quite tangential and unable to answer any questions appropriately.  Procedure (7/10): INTRAMEDULLARY (IM) NAIL INTERTROCHANTRIC (Left)  Meal completion has been 5-25%. Pt unable to respond to questions appropriately. When pt was asked on her usual intake PTA, pt responded by taking about her bedside table. RD unable to obtain nutrition history. No family at bedside. Pt currently has Ensure ordered and has been consuming them. RD to continue with current orders.   Nutrition-Focused physical exam completed. Findings are moderate fat depletion, moderate to severe muscle depletion, and no edema.   Labs and medications reviewed.   Diet Order:  Diet - low sodium heart healthy Diet regular Room service appropriate?: Yes; Fluid consistency:: Thin  Skin:   (Incision on L hip)  Last BM:  7/10  Height:   Ht Readings from Last 1 Encounters:  No data found for Ht    Weight:   Wt Readings from Last 1 Encounters:  09/29/15 100 lb (45.36 kg)    Ideal Body Weight:    N/A  BMI:  There is no height on file to calculate  BMI.  Estimated Nutritional Needs:   Kcal:  1250-1500  Protein:  50-65 grams  Fluid:  >/= 1.5 L/day  EDUCATION NEEDS:   No education needs identified at this time  Roslyn SmilingStephanie Camay Pedigo, MS, RD, LDN Pager # [phone] After hours/ weekend pager # [phone]

## 2015-10-01 NOTE — Progress Notes (Addendum)
Patient ID: Debra Flores, female   DOB: 06-02-1922, 82 y.o.   MRN: [id number]  PROGRESS NOTE    Debra Flores  [id number]:[id number] DOB: [birth year] DOA: 09/29/2015  PCP: No primary care provider on file.    Brief Narrative:  82 y.o. female with past medical history significant for advanced dementia (from memory care unit), hypertension, depression who presented to [facility] ED status post fall. She sustained left femoral neck fracture. She is s/p intramedullary nail 09/30/2015.   Assessment & Plan:   Principal Problem:   Intertrochanteric fracture of left femur (HCC) - Status post fall - S/p intramedullary nail 7/10 by Dr. Ophelia Charter - D/C to SNF once medically stable, specifically we will have to repeat CBC tomorrow morning to make sure hemoglobin remains stable around 9 or above.  Active Problems:   Acute postoperative blood loss anemia - Hemoglobin dropped from 12.2 down to 9.7 this morning, likely postoperative - No evidence of gross bleeding - No current need for blood transfusion but we will have to repeat hemoglobin tomorrow morning and is stable that she would be okay for discharge to skilled nursing facility - She is on aspirin which we can continue at this time but low threshold to hold if hemoglobin continues to drop    Anxiety and depression - Continue lexapro    Hypothyroidism - Continue synthroid    Dementia - Stable - From memory care unit    Benign essential HTN - Continue atenolol    Hypokalemia - Subsequently resolved     Moderate protein calorie malnutrition - In the context of dementia, chronic illness - Continue nutritional supplementation    DVT prophylaxis: aspirin and SCD's bilaterally  Code Status: DNR/DNI Family Communication: called son over phone [phone] to inform him of possible discharge tomorrow  Disposition Plan: to SNF ([facility]) likely 10/02/2015. Will repeat hemoglobin tomorrow morning to make sure hemoglobin is stable. Her hemoglobin  dropped by 3 units in last 24 hours.   Consultants:   Dr. Ophelia Charter, orthopedic surgery   Procedures:   Intramedullary nail 09/30/2015  Antimicrobials:   Rocephin 09/29/2015   Cefazolin 09/30/2015   Subjective: No overnight events  Objective: Filed Vitals:   09/30/15 1504 09/30/15 2003 10/01/15 0038 10/01/15 0530  BP: 142/108 130/59 119/73 109/48  Pulse: 95 103 80 80  Temp: 97.3 F (36.3 C) 97.3 F (36.3 C) 98.1 F (36.7 C) 98 F (36.7 C)  TempSrc: Axillary Axillary Axillary Axillary  Resp: Weight:      SpO2: 94% 91% 95% 93%    Intake/Output Summary (Last 24 hours) at 10/01/15 0942 Last data filed at 10/01/15 0900  Gross per 24 hour  Intake 1863.33 ml  Output    950 ml  Net 913.33 ml   Filed Weights   09/29/15 0218  Weight: 45.36 kg (100 lb)    Examination:  General exam: No acute distress Respiratory system: No wheezing, no rhonchi Cardiovascular system: S1 & S2 heard, rate controlled Gastrointestinal system: Appreciate bowel sounds, nontender abdomen Central nervous system: Nonfocal Extremities: No edema, appreciate bilateral pulses Skin: Skin is warm and dry Psychiatry: Normal mood, no agitation or restlessness  Data Reviewed: I have personally reviewed following labs and imaging studies  CBC:  Recent Labs Lab 09/29/15 0412 09/29/15 1142 09/30/15 0322 10/01/15 0431  WBC 7.2 6.9 5.9 9.7  NEUTROABS 5.5  --   --   --   HGB 13.1 12.1 12.2 9.7*  HCT 39.4 37.3 38.2 29.5*  MCV 94.5  95.2 94.8 94.6  PLT 163 165 175 163   Basic Metabolic Panel:  Recent Labs Lab 09/29/15 0412 09/29/15 1142 09/30/15 0322 10/01/15 0431  NA 137  --  139 137  K 3.4*  --  4.0 4.1  CL 104  --  108 105  CO2 27  --  24 23  GLUCOSE 107*  --  96 107*  BUN 32*  --  18 29*  CREATININE 0.77 0.70 0.76 1.11*  CALCIUM 8.5*  --  8.9 8.4*   GFR: CrCl cannot be calculated (Unknown ideal weight.). Liver Function Tests:  Recent Labs Lab 09/29/15 0412  AST 29   ALT 25  ALKPHOS 43  BILITOT 1.3*  PROT 6.4*  ALBUMIN 4.1   No results for input(s): LIPASE, AMYLASE in the last 168 hours. No results for input(s): AMMONIA in the last 168 hours. Coagulation Profile:  Recent Labs Lab 09/29/15 0412  INR 0.99   Cardiac Enzymes: No results for input(s): CKTOTAL, CKMB, CKMBINDEX, TROPONINI in the last 168 hours. BNP (last 3 results) No results for input(s): PROBNP in the last 8760 hours. HbA1C: No results for input(s): HGBA1C in the last 72 hours. CBG: No results for input(s): GLUCAP in the last 168 hours. Lipid Profile: No results for input(s): CHOL, HDL, LDLCALC, TRIG, CHOLHDL, LDLDIRECT in the last 72 hours. Thyroid Function Tests:  Recent Labs  09/29/15 0412  TSH 17.851*   Anemia Panel: No results for input(s): VITAMINB12, FOLATE, FERRITIN, TIBC, IRON, RETICCTPCT in the last 72 hours. Urine analysis:    Component Value Date/Time   COLORURINE YELLOW 09/29/2015 0440   APPEARANCEUR CLOUDY* 09/29/2015 0440   LABSPEC 1.025 09/29/2015 0440   PHURINE 6.0 09/29/2015 0440   GLUCOSEU NEGATIVE 09/29/2015 0440   HGBUR TRACE* 09/29/2015 0440   BILIRUBINUR NEGATIVE 09/29/2015 0440   KETONESUR NEGATIVE 09/29/2015 0440   PROTEINUR NEGATIVE 09/29/2015 0440   NITRITE POSITIVE* 09/29/2015 0440   LEUKOCYTESUR MODERATE* 09/29/2015 0440   Sepsis Labs: @LABRCNTIP (procalcitonin:4,lacticidven:4)    MRSA PCR Screening     Status: None   Collection Time: 09/29/15  8:49 PM  Result Value Ref Range Status   MRSA by PCR NEGATIVE NEGATIVE Final      Radiology Studies: Dg Chest 1 View 09/29/2015   Mild cardiomegaly and tortuous atherosclerotic thoracic aorta. No definite acute abnormality. Electronically Signed   By: Rubye Oaks M.D.   On: 09/29/2015 04:01   Dg Pelvis 1-2 Views 09/29/2015   Mildly displaced left subcapital femoral neck fracture. Electronically Signed   By: Rubye Oaks M.D.   On: 09/29/2015 03:16   Dg C-arm 1-60  Min 09/30/2015   Post ORIF LEFT femur. Electronically Signed   By: Ulyses Southward M.D.   On: 09/30/2015 12:22   Dg Hip Unilat With Pelvis 2-3 Views Left 09/29/2015   Left proximal femur fracture involves the intertrochanteric femur, not the femoral neck as reported on prior exam. The femoral neck appears intact. Electronically Signed   By: Rubye Oaks M.D.   On: 09/29/2015 04:00   Dg Femur Min 2 Views Left 09/30/2015  Post ORIF LEFT femur. Electronically Signed   By: Ulyses Southward M.D.   On: 09/30/2015 12:22     Scheduled Meds: . aspirin EC  325 mg Oral Q breakfast  . atenolol  12.5 mg Oral Daily  . busPIRone  15 mg Oral BID  . docusate sodium  100 mg Oral BID  . escitalopram  15 mg Oral Daily  . feeding supplement (  ENSURE ENLIVE)  237 mL Oral BID BM  . levothyroxine  75 mcg Oral QAC breakfast  . senna  1 tablet Oral BID   Continuous Infusions: . lactated ringers 50 mL/hr at 09/30/15 2121     LOS: 2 days    Time spent: 25 minutes  Greater than 50% of the time spent on counseling and coordinating the care.   Manson PasseyEVINE, Jaxie Racanelli, MD [facility] Pager [phone]  If 7PM-7AM, please contact night-coverage [email or web address] Password [email or web address] 10/01/2015, 9:42 AM

## 2015-10-01 NOTE — Evaluation (Signed)
Clinical/Bedside Swallow Evaluation Patient Details  Name: Debra Flores MRN: [id number] Date of Birth: 1922/04/04  Today's Date: 10/01/2015 Time: SLP Start Time (ACUTE ONLY): 0834 SLP Stop Time (ACUTE ONLY): 0853 SLP Time Calculation (min) (ACUTE ONLY): 19 min  Past Medical History:  Past Medical History  Diagnosis Date  . Anxiety   . Thyroid disease   . Hypertension   . Depression, major (HCC)   . Dementia    Past Surgical History: History reviewed. No pertinent past surgical history. HPI:      Assessment / Plan / Recommendation Clinical Impression  Pt demosntrates cognitive impairment that impacts pts ability to feed herself. Pt with only briefly sustained attentiton to any functional task causing her to spill foods and forget to eat. No signs of aspiration observed when assistance provided. Recommend pt continue regular diet and thin liquids with full supervision. No SLP f/u needed, will sign off.     Aspiration Risk  Mild aspiration risk    Diet Recommendation Regular;Thin liquid   Liquid Administration via: Cup;Straw Medication Administration: Whole meds with liquid Supervision: Staff to assist with self feeding;Full supervision/cueing for compensatory strategies Compensations: Minimize environmental distractions Postural Changes: Seated upright at 90 degrees    Other  Recommendations     Follow up Recommendations  None    Frequency and Duration            Prognosis        Swallow Study   General Type of Study: Bedside Swallow Evaluation Previous Swallow Assessment: none Diet Prior to this Study: Regular;Thin liquids Temperature Spikes Noted: No Respiratory Status: Room air History of Recent Intubation: No Behavior/Cognition: Alert;Confused Oral Cavity Assessment: Dry Oral Cavity - Dentition: Adequate natural dentition Vision: Functional for self-feeding Self-Feeding Abilities: Needs assist Patient Positioning: Upright in bed Baseline Vocal  Quality: Normal Volitional Cough: Cognitively unable to elicit Volitional Swallow: Unable to elicit    Oral/Motor/Sensory Function Overall Oral Motor/Sensory Function: Within functional limits   Ice Chips     Thin Liquid Thin Liquid: Within functional limits Presentation: Straw;Self Fed    Nectar Thick     Honey Thick     Puree Puree: Within functional limits   Solid   GO   Solid: Within functional limits       [facility] Island Medical CenterBonnie Rukia Mcgillivray, MA CCC-SLP [phone]  Claudine MoutonDeBlois, Jennetta Flood Caroline 10/01/2015,9:46 AM

## 2015-10-02 ENCOUNTER — Encounter (HOSPITAL_COMMUNITY): Payer: Self-pay | Admitting: Orthopaedic Surgery

## 2015-10-02 DIAGNOSIS — S72142S Displaced intertrochanteric fracture of left femur, sequela: Secondary | ICD-10-CM

## 2015-10-02 DIAGNOSIS — F418 Other specified anxiety disorders: Secondary | ICD-10-CM

## 2015-10-02 LAB — BASIC METABOLIC PANEL
Anion gap: 5 (ref 5–15)
BUN: 29 mg/dL — ABNORMAL HIGH (ref 6–20)
CALCIUM: 8.2 mg/dL — AB (ref 8.9–10.3)
CO2: 28 mmol/L (ref 22–32)
CREATININE: 0.82 mg/dL (ref 0.44–1.00)
Chloride: 107 mmol/L (ref 101–111)
GFR calc Af Amer: >60 mL/min (ref >60)
GFR calc non Af Amer: >60 mL/min (ref >60)
GLUCOSE: 105 mg/dL — AB (ref 65–99)
Potassium: 3.9 mmol/L (ref 3.5–5.1)
Sodium: 140 mmol/L (ref 135–145)

## 2015-10-02 LAB — CBC
HCT: 25.6 % — ABNORMAL LOW (ref 36.0–46.0)
HEMATOCRIT: 25.2 % — AB (ref 36.0–46.0)
Hemoglobin: 8.3 g/dL — ABNORMAL LOW (ref 12.0–15.0)
Hemoglobin: 8.2 g/dL — ABNORMAL LOW (ref 12.0–15.0)
MCH: 31.2 pg (ref 26.0–34.0)
MCH: 30.5 pg (ref 26.0–34.0)
MCHC: 32.9 g/dL (ref 30.0–36.0)
MCHC: 32 g/dL (ref 30.0–36.0)
MCV: 94.7 fL (ref 78.0–100.0)
MCV: 95.2 fL (ref 78.0–100.0)
PLATELETS: 146 10*3/uL — AB (ref 150–400)
Platelets: 134 10*3/uL — ABNORMAL LOW (ref 150–400)
RBC: 2.66 MIL/uL — ABNORMAL LOW (ref 3.87–5.11)
RBC: 2.69 MIL/uL — ABNORMAL LOW (ref 3.87–5.11)
RDW: 13.5 % (ref 11.5–15.5)
RDW: 13.4 % (ref 11.5–15.5)
WBC: 6.1 10*3/uL (ref 4.0–10.5)
WBC: 6.6 10*3/uL (ref 4.0–10.5)

## 2015-10-02 LAB — URINE CULTURE: SPECIAL REQUESTS: NORMAL

## 2015-10-02 MED ORDER — LEVOTHYROXINE SODIUM 100 MCG PO TABS
100.0000 ug | ORAL_TABLET | Freq: Every day | ORAL | Status: DC
  Administered 2015-10-02 – 2015-10-03 (×2): 100 ug via ORAL
  Filled 2015-10-02 (×2): qty 1

## 2015-10-02 MED ORDER — HYDROCODONE-ACETAMINOPHEN 5-325 MG PO TABS: 1.0000 | ORAL_TABLET | Freq: Four times a day (QID) | ORAL | Status: AC | PRN

## 2015-10-02 MED ORDER — ASPIRIN 325 MG PO TBEC: 325.0000 mg | DELAYED_RELEASE_TABLET | Freq: Every day | ORAL | Status: AC

## 2015-10-02 NOTE — Progress Notes (Signed)
OT Cancellation Note  Patient Details Name: Debra MainMargaret Golding MRN: [id number] DOB: [birth year]   Cancelled Treatment:    Reason Eval/Treat Not Completed: Other (comment) Pt is Medicare from SNF and current D/C plan is back to SNF. No apparent immediate acute care OT needs, therefore will defer OT to SNF. If OT eval is needed please call Acute Rehab Dept. at [phone] or text page OT at [phone].    Evette GeorgesLeonard, Wayman Hoard Eva [phone] 10/02/2015, 7:03 AM

## 2015-10-02 NOTE — Progress Notes (Signed)
Patient ID: Debra Flores, female   DOB: 12-May-1922, 82 y.o.   MRN: [id number]  PROGRESS NOTE    Debra Flores  [id number] DOB: 12-May-1922 DOA: 09/29/2015  PCP: No primary care provider on file.    Brief Narrative:  82 y.o. female with past medical history significant for advanced dementia (from memory care unit), hypertension, depression who [facility]. She sustained left femoral neck fracture. She is s/p intramedullary nail 09/30/2015.   Assessment & Plan:   Principal Problem:   Intertrochanteric fracture of left femur (HCC) - Status post fall - S/p intramedullary nail 7/10 by Dr. Ophelia CharterYates - D/C to SNF once medically stable, Hb trending down further, no overt bleeding monitor    Acute postoperative blood loss anemia - Hemoglobin dropped from 12.2 down to 9.7 -> now 8.2 -due to post op blood loss and hemodilution - No evidence of overt bleeding, d/w RN - will hold ASA - CBC in am    Anxiety and depression - Continue lexapro    Hypothyroidism - TSH very high, will increase synthroid dose    Advanced Dementia - Stable, pleasantly confused - From memory care unit    Benign essential HTN - Continue atenolol    Hypokalemia - resolved     Moderate protein calorie malnutrition - In the context of dementia, chronic illness - Continue nutritional supplementation  DVT prophylaxis: aspirin and SCD's bilaterally  Code Status: DNR/DNI Family Communication: No family at bedside Disposition Plan: SNF tomorrow if Hb stable   Consultants:   Dr. Ophelia CharterYates, orthopedic surgery   Procedures:   Intramedullary nail 09/30/2015  Antimicrobials:   Rocephin 09/29/2015   Cefazolin 09/30/2015   Subjective: No overnight events  Objective: Filed Vitals:   10/01/15 1300 10/01/15 2059 10/02/15 0504 10/02/15 1210  BP: 130/54 134/54 148/62 145/53  Pulse: 79 86 79 86  Temp: 98 F (36.7 C) 98.1 F (36.7 C) 98.2 F (36.8 C) 98.4 F (36.9 C)  TempSrc:  Axillary Axillary Axillary Oral  Resp: 16 15 16 16   Weight:      SpO2: 97% 97% 97% 96%    Intake/Output Summary (Last 24 hours) at 10/02/15 1332 Last data filed at 10/02/15 0900  Gross per 24 hour  Intake    360 ml  Output    400 ml  Net    -40 ml   Filed Weights   09/29/15 0218  Weight: 45.36 kg (100 lb)    Examination:  General exam: No acute distress, pleasantly confused Respiratory system: No wheezing, no rhonchi Cardiovascular system: S1 & S2 heard, rate controlled Gastrointestinal system: Appreciate bowel sounds, nontender abdomen Central nervous system: Nonfocal Extremities: No edema, appreciate bilateral pulses Skin: Skin is warm and dry Psychiatry: Normal mood, no agitation or restlessness  Data Reviewed: I have personally reviewed following labs and imaging studies  CBC:  Recent Labs Lab 09/29/15 0412 09/29/15 1142 09/30/15 0322 10/01/15 0431 10/02/15 0415 10/02/15 1134  WBC 7.2 6.9 5.9 9.7 6.1 6.6  NEUTROABS 5.5  --   --   --   --   --   HGB 13.1 12.1 12.2 9.7* 8.3* 8.2*  HCT 39.4 37.3 38.2 29.5* 25.2* 25.6*  MCV 94.5 95.2 94.8 94.6 94.7 95.2  PLT 163 165 175 163 134* 146*   Basic Metabolic Panel:  Recent Labs Lab 09/29/15 0412 09/29/15 1142 09/30/15 0322 10/01/15 0431 10/02/15 0415  NA 137  --  139 137 140  K 3.4*  --  4.0 4.1  3.9  CL 104  --  108 105 107  CO2 27  --  24 23 28   GLUCOSE 107*  --  96 107* 105*  BUN 32*  --  18 29* 29*  CREATININE 0.77 0.70 0.76 1.11* 0.82  CALCIUM 8.5*  --  8.9 8.4* 8.2*   GFR: CrCl cannot be calculated (Unknown ideal weight.). Liver Function Tests:  Recent Labs Lab 09/29/15 0412  AST 29  ALT 25  ALKPHOS 43  BILITOT 1.3*  PROT 6.4*  ALBUMIN 4.1   No results for input(s): LIPASE, AMYLASE in the last 168 hours. No results for input(s): AMMONIA in the last 168 hours. Coagulation Profile:  Recent Labs Lab 09/29/15 0412  INR 0.99   Cardiac Enzymes: No results for input(s): CKTOTAL, CKMB,  CKMBINDEX, TROPONINI in the last 168 hours. BNP (last 3 results) No results for input(s): PROBNP in the last 8760 hours. HbA1C: No results for input(s): HGBA1C in the last 72 hours. CBG: No results for input(s): GLUCAP in the last 168 hours. Lipid Profile: No results for input(s): CHOL, HDL, LDLCALC, TRIG, CHOLHDL, LDLDIRECT in the last 72 hours. Thyroid Function Tests: No results for input(s): TSH, T4TOTAL, FREET4, T3FREE, THYROIDAB in the last 72 hours. Anemia Panel: No results for input(s): VITAMINB12, FOLATE, FERRITIN, TIBC, IRON, RETICCTPCT in the last 72 hours. Urine analysis:    Component Value Date/Time   COLORURINE YELLOW 09/29/2015 0440   APPEARANCEUR CLOUDY* 09/29/2015 0440   LABSPEC 1.025 09/29/2015 0440   PHURINE 6.0 09/29/2015 0440   GLUCOSEU NEGATIVE 09/29/2015 0440   HGBUR TRACE* 09/29/2015 0440   BILIRUBINUR NEGATIVE 09/29/2015 0440   KETONESUR NEGATIVE 09/29/2015 0440   PROTEINUR NEGATIVE 09/29/2015 0440   NITRITE POSITIVE* 09/29/2015 0440   LEUKOCYTESUR MODERATE* 09/29/2015 0440   Sepsis Labs: @LABRCNTIP (procalcitonin:4,lacticidven:4)    MRSA PCR Screening     Status: None   Collection Time: 09/29/15  8:49 PM  Result Value Ref Range Status   MRSA by PCR NEGATIVE NEGATIVE Final      Radiology Studies: Dg Chest 1 View 09/29/2015   Mild cardiomegaly and tortuous atherosclerotic thoracic aorta. No definite acute abnormality. Electronically Signed   By: Rubye Oaks M.D.   On: 09/29/2015 04:01   Dg Pelvis 1-2 Views 09/29/2015   Mildly displaced left subcapital femoral neck fracture. Electronically Signed   By: Rubye Oaks M.D.   On: 09/29/2015 03:16   Dg C-arm 1-60 Min 09/30/2015   Post ORIF LEFT femur. Electronically Signed   By: Ulyses Southward M.D.   On: 09/30/2015 12:22   Dg Hip Unilat With Pelvis 2-3 Views Left 09/29/2015   Left proximal femur fracture involves the intertrochanteric femur, not the femoral neck as reported on prior exam. The femoral  neck appears intact. Electronically Signed   By: Rubye Oaks M.D.   On: 09/29/2015 04:00   Dg Femur Min 2 Views Left 09/30/2015  Post ORIF LEFT femur. Electronically Signed   By: Ulyses Southward M.D.   On: 09/30/2015 12:22     Scheduled Meds: . aspirin EC  325 mg Oral Q breakfast  . atenolol  12.5 mg Oral Daily  . busPIRone  15 mg Oral BID  . docusate sodium  100 mg Oral BID  . escitalopram  15 mg Oral Daily  . feeding supplement (ENSURE ENLIVE)  237 mL Oral BID BM  . levothyroxine  100 mcg Oral QAC breakfast  . senna  1 tablet Oral BID   Continuous Infusions:  LOS: 3 days    Time spent: 35 minutes  Greater than 50% of the time spent on counseling and coordinating the care.   Zannie Cove, MD [facility] Pager [phone]  If 7PM-7AM, please contact night-coverage [email or web address] Password [email or web address] 10/02/2015, 1:32 PM

## 2015-10-02 NOTE — Clinical Social Work Note (Signed)
Clinical Social Work Assessment  Patient Details  Name: Debra Flores MRN: [id number] Date of Birth: 04/16/1922  Date of referral:  10/02/15               Reason for consult:  Facility Placement, Discharge Planning                Permission sought to share information with:  Facility Medical sales representativeContact Representative, Family Supports Permission granted to share information::  Yes, Verbal Permission Granted  Name::     Lloyd Hugereil  Agency::  [facility]  Relationship::  Son  Contact Information:  [phone]  Housing/Transportation Living arrangements for the past 2 months:  Assisted Living Facility [facility] Source of Information:  Adult Children Patient Interpreter Needed:  None Criminal Activity/Legal Involvement Pertinent to Current Situation/Hospitalization:  No - Comment as needed Significant Relationships:  Adult Children Lives with:  Facility Resident Do you feel safe going back to the place where you live?  Yes Need for family participation in patient care:  Yes (Comment) (Patient's son active in patient's care.)  Care giving concerns:  Patient's son expressed no concerns at this time.   Social Worker assessment / plan:  LCSW received referral for possible SNF placement at time of discharge. LCSW spoke with patient's son who stated patient's son [facility] for SN[facility]. LCS[facility]'s [facility], [facility]'s [facility]. LCSW to continue to follow and assist with discharge planning needs.  Employment status:  Retired Health and safety inspectornsurance information:  Medicare PT Recommendations:  Skilled Nursing Facility Information / Referral to community resources:  Skilled Nursing Facility  Patient/Family's Response to care:  Patient's son understanding and agreeable to Johnson [employer] JohnsonLCSW plan of care.  Patient/Family's Understanding of and Emotional Response to Diagnosis, Current Treatment, and Prognosis:  Patient's son  understanding and agreeable to LCSW plan of care.  Emotional Assessment Appearance:   (LCSW spoke with patient's son.) Attitude/Demeanor/Rapport:   (LCSW spoke with patient's son.) Affect (typically observed):   (LCSW spoke with patient's son.) Orientation:  Oriented to Self Alcohol / Substance use:  Not Applicable Psych involvement (Current and /or in the community):  No (Comment) (Not appropriate on this admission.)  Discharge Needs  Concerns to be addressed:  No discharge needs identified Readmission within the last 30 days:  No Current discharge risk:  None Barriers to Discharge:  No Barriers Identified   Rod MaeVaughn, Sora Vrooman S, LCSW 10/02/2015, 2:33 PM [phone]

## 2015-10-02 NOTE — Progress Notes (Signed)
Subjective: Does not respond to questions appropriately.   Objective: Vital signs in last 24 hours: Temp:  [97.8 F (36.6 C)-98.4 F (36.9 C)] 97.8 F (36.6 C) (07/12 1300) Pulse Rate:  [79-86] 86 (07/12 1300) Resp:  [15-16] 16 (07/12 1300) BP: (134-148)/(53-62) 140/62 mmHg (07/12 1300) SpO2:  [95 %-97 %] 95 % (07/12 1300)  Intake/Output from previous day: 07/11 0701 - 07/12 0700 In: 360 [P.O.:360] Out: 400 [Urine:400] Intake/Output this shift: Total I/O In: 240 [P.O.:240] Out: -    Recent Labs  09/30/15 0322 10/01/15 0431 10/02/15 0415 10/02/15 1134  HGB 12.2 9.7* 8.3* 8.2*    Recent Labs  10/02/15 0415 10/02/15 1134  WBC 6.1 6.6  RBC 2.66* 2.69*  HCT 25.2* 25.6*  PLT 134* 146*    Recent Labs  10/01/15 0431 10/02/15 0415  NA 137 140  K 4.1 3.9  CL 105 107  CO2 23 28  BUN 29* 29*  CREATININE 1.11* 0.82  GLUCOSE 107* 105*  CALCIUM 8.4* 8.2*   No results for input(s): LABPT, INR in the last 72 hours.  Exam:   Appears comfortable.  Wounds look good.  Staples intact.  No drainage or signs of infection.  Calves nontender.   Assessment/Plan: Stable from ortho standpoint. Transfer to snf when medically stable.    OWENS,JAMES M 10/02/2015, 3:03 PM

## 2015-10-02 NOTE — Clinical Social Work Placement (Signed)
   CLINICAL SOCIAL WORK PLACEMENT  NOTE  Date:  10/02/2015  Patient Details  Name: Debra Flores MRN: [id number] Date of Birth: 05-06-1922  Clinical Social Work is seeking post-discharge placement for this patient at the Skilled  Nursing Facility level of care (*CSW will initial, date and re-position this form in  chart as items are completed):  Yes   Patient/family provided with [place] Clinical Social Work Department's list of facilities offering this level of care within the geographic area requested by the patient (or if unable, by the patient's family).  Yes   Patient/family informed of their freedom to choose among providers that offer the needed level of care, that participate in Medicare, Medicaid or managed care program needed by the patient, have an available bed and are willing to accept the patient.  Yes   Patient/family informed of [facility]'s ownership interest in [facility] and [facility], as well as of the fact that they are under no obligation to receive care at these facilities.  PASRR submitted to EDS on 10/01/15     PASRR number received on 10/01/15     Existing PASRR number confirmed on       FL2 transmitted to all facilities in geographic area requested by pt/family on 10/01/15     FL2 transmitted to all facilities within larger geographic area on       Patient informed that his/her managed care company has contracts with or will negotiate with certain facilities, including the following:        Yes   Patient/family informed of bed offers received.  Patient [facility]     Physician recommends and patient chooses bed at      Patient [facility] be [facility] on  .  Patient to be transferred to facility by       Patient family notified on   of transfer.  Name of family member notified:        PHYSICIAN Please sign FL2     Additional Comment:     _______________________________________________ Rod MaeVaughn, Kellina Dreese S, LCSW 10/02/2015, 2:29 PM

## 2015-10-03 DIAGNOSIS — F0390 Unspecified dementia without behavioral disturbance: Secondary | ICD-10-CM

## 2015-10-03 LAB — BASIC METABOLIC PANEL
ANION GAP: 5 (ref 5–15)
BUN: 27 mg/dL — ABNORMAL HIGH (ref 6–20)
CHLORIDE: 106 mmol/L (ref 101–111)
CO2: 26 mmol/L (ref 22–32)
CREATININE: 0.67 mg/dL (ref 0.44–1.00)
Calcium: 8.4 mg/dL — ABNORMAL LOW (ref 8.9–10.3)
GFR calc non Af Amer: >60 mL/min (ref >60)
Glucose, Bld: 97 mg/dL (ref 65–99)
Potassium: 4.1 mmol/L (ref 3.5–5.1)
SODIUM: 137 mmol/L (ref 135–145)

## 2015-10-03 LAB — CBC
HCT: 26 % — ABNORMAL LOW (ref 36.0–46.0)
Hemoglobin: 8.4 g/dL — ABNORMAL LOW (ref 12.0–15.0)
MCH: 31 pg (ref 26.0–34.0)
MCHC: 32.3 g/dL (ref 30.0–36.0)
MCV: 95.9 fL (ref 78.0–100.0)
PLATELETS: 166 10*3/uL (ref 150–400)
RBC: 2.71 MIL/uL — AB (ref 3.87–5.11)
RDW: 13.4 % (ref 11.5–15.5)
WBC: 6.5 10*3/uL (ref 4.0–10.5)

## 2015-10-03 MED ORDER — LEVOTHYROXINE SODIUM 50 MCG PO TABS: 100.0000 ug | ORAL_TABLET | Freq: Every day | ORAL | Status: AC

## 2015-10-03 NOTE — Clinical Social Work Placement (Signed)
   CLINICAL SOCIAL WORK PLACEMENT  NOTE  Date:  10/03/2015  Patient Details  Name: Debra Flores MRN: [id number] Date of Birth: 08/11/1922  Clinical Social Work is seeking post-discharge placement for this patient at the Skilled  Nursing Facility level of care (*CSW will initial, date and re-position this form in  chart as items are completed):  Yes   Patient/family provided with [place] Clinical Social Work Department's list of facilities offering this level of care within the geographic area requested by the patient (or if unable, by the patient's family).  Yes   Patient/family informed of their freedom to choose among providers that offer the needed level of care, that participate in Medicare, Medicaid or managed care program needed by the patient, have an available bed and are willing to accept the patient.  Yes   Patient/family informed of [facility]'s ownership interest in [facility] and [facility], as well as of the fact that they are under no obligation to receive care at these facilities.  PASRR submitted to EDS on 10/01/15     PASRR number received on 10/01/15     Existing PASRR number confirmed on       FL2 transmitted to all facilities in geographic area requested by pt/family on 10/01/15     FL2 transmitted to all facilities within larger geographic area on       Patient informed that his/her managed care company has contracts with or will negotiate with certain facilities, including the following:        Yes   Patient/family informed of bed offers received.  [facility]     Physician recommends and patient chooses bed at      Patient [facility] on 10/03/15.  Patient to be transferred to facility by [facility]     Patient family notified on 10/03/15 of transfer.  Name of family member notified:  Margo AyeSon, Neil     PHYSICIAN Please sign FL2     Additional Comment:     _______________________________________________ Rod MaeVaughn, Jaze Rodino S, LCSW 10/03/2015, 11:59 AM

## 2015-10-03 NOTE — Clinical Social Work Note (Signed)
Patient to be discharged to [facility]. (Bed choice confirmed on 10/02/2015) Message left for patient's son, Lloyd Hugereil. Patient to be discharged via [facility].  RN report number: [id number]  Marcelline Deistmily Cambrey Lupi, LCSW [phone] Orthopedics: [phone] Surgical: [phone]

## 2015-10-03 NOTE — Progress Notes (Signed)
Physical Therapy Treatment Patient Details Name: Debra Flores MRN: [id number] DOB: Sep 29, 1922 Today's Date: 10/03/2015    History of Present Illness Pt is a 82 y/o female s/p L femoral fx, post-op intramedullary nail. PMH including but not limited to dementia and HTN.    PT Comments    Pt with difficulty fully participating in therapy session secondary to cognitive deficits at baseline. Pt continued to require physical assist of two for all bed mobility and total assist of two for transfers. Pt would continue to benefit from skilled physical therapy services at this time while inpatient and after d/c.    Follow Up Recommendations  SNF     Equipment Recommendations  None recommended by PT    Recommendations for Other Services       Precautions / Restrictions Precautions Precautions: Fall Restrictions Weight Bearing Restrictions: Yes LLE Weight Bearing: Weight bearing as tolerated    Mobility  Bed Mobility Overal bed mobility: +2 for physical assistance;Needs Assistance Bed Mobility: Supine to Sit     Supine to sit: Total assist;+2 for physical assistance     General bed mobility comments: utilized pad underneath pt to assist with transfer  Transfers Overall transfer level: Needs assistance Equipment used: 2 person hand held assist Transfers: Stand Pivot Transfers   Stand pivot transfers: Total assist;+2 physical assistance          Ambulation/Gait                 Stairs            Wheelchair Mobility    Modified Rankin (Stroke Patients Only)       Balance Overall balance assessment: Needs assistance Sitting-balance support: Bilateral upper extremity supported Sitting balance-Leahy Scale: Poor   Postural control: Posterior lean   Standing balance-Leahy Scale: Zero                      Cognition Arousal/Alertness: Awake/alert Behavior During Therapy: WFL for tasks assessed/performed Overall Cognitive Status: History of  cognitive impairments - at baseline                      Exercises      General Comments        Pertinent Vitals/Pain Pain Assessment: Faces Faces Pain Scale: Hurts even more Pain Location: L hip Pain Descriptors / Indicators: Discomfort Pain Intervention(s): Monitored during session;Repositioned    Home Living                      Prior Function            PT Goals (current goals can now be found in the care plan section) Acute Rehab PT Goals Patient Stated Goal: Pt unable to state goals secondary to cognitive deficits (at baseline). PT Goal Formulation: Patient unable to participate in goal setting Time For Goal Achievement: 10/08/15 Potential to Achieve Goals: Fair Progress towards PT goals: Progressing toward goals    Frequency  Min 3X/week    PT Plan Current plan remains appropriate    Co-evaluation             End of Session Equipment Utilized During Treatment: Gait belt Activity Tolerance: Patient limited by pain Patient left: in chair;with call bell/phone within reach;with chair alarm set     Time: [id number] PT Time Calculation (min) (ACUTE ONLY): 14 min  Charges:  $Therapeutic Activity: 8-22 mins  G CodesAlessandra Bevels Rylei Masella Oct 05, 2015, 12:31 PM Deborah Chalk, PT, DPT [phone]

## 2015-10-03 NOTE — Progress Notes (Signed)
Subjective: 3 Days Post-Op Procedure(s) (LRB): INTRAMEDULLARY (IM) NAIL INTERTROCHANTRIC (Left) Patient reports pain as mild.    Objective: Vital signs in last 24 hours: Temp:  [97.8 F (36.6 C)-98.8 F (37.1 C)] 98.8 F (37.1 C) (07/12 2142) Pulse Rate:  [79-86] 79 (07/12 2142) Resp:  [16] 16 (07/12 2142) BP: (135-145)/(53-62) 135/61 mmHg (07/12 2142) SpO2:  [95 %-96 %] 95 % (07/12 2142)  Intake/Output from previous day: 07/12 0701 - 07/13 0700 In: 480 [P.O.:480] Out: 400 [Urine:400] Intake/Output this shift:     Recent Labs  10/01/15 0431 10/02/15 0415 10/02/15 1134 10/03/15 0002  HGB 9.7* 8.3* 8.2* 8.4*    Recent Labs  10/02/15 1134 10/03/15 0002  WBC 6.6 6.5  RBC 2.69* 2.71*  HCT 25.6* 26.0*  PLT 146* 166    Recent Labs  10/02/15 0415 10/03/15 0002  NA 140 137  K 3.9 4.1  CL 107 106  CO2 28 26  BUN 29* 27*  CREATININE 0.82 0.67  GLUCOSE 105* 97  CALCIUM 8.2* 8.4*   No results for input(s): LABPT, INR in the last 72 hours.  Neurologically intact  Assessment/Plan: 3 Days Post-Op Procedure(s) (LRB): INTRAMEDULLARY (IM) NAIL INTERTROCHANTRIC (Left) Up with therapy  Kaleiah Kutzer C 10/03/2015, 7:26 AM

## 2015-10-03 NOTE — Progress Notes (Signed)
[facility].

## 2015-10-03 NOTE — Care Management Important Message (Signed)
Important Message  Patient Details  Name: Debra Flores MRN: [id number] Date of Birth: 20-May-1922   Medicare Important Message Given:  Yes    [name], [name] 10/03/2015, 8:23 AM

## 2015-10-03 NOTE — Discharge Summary (Signed)
Physician Discharge Summary  Debra Flores WUJ:[id number] DOB: June 18, 1922 DOA: 09/29/2015  PCP: No primary care provider on file.  Admit date: 09/29/2015 Discharge date: 10/03/2015  Time spent: 45 minutes  Recommendations for Outpatient Follow-up:  1. Dr.Mark Ophelia Charter in 2 weeks 2. PCP in 1 week   Discharge Diagnoses:  Principal Problem:   Intertrochanteric fracture of left femur (HCC) Active Problems:   Anxiety and depression   Hypothyroidism   Dementia   Benign essential HTN   Hip fracture requiring operative repair (HCC)   Hypokalemia   Malnutrition of moderate degree   Discharge Condition: stable  Diet recommendation: heart healthy  Filed Weights   09/29/15 0218  Weight: 45.36 kg (100 lb)    History of present illness:  82 y.o. female with past medical history significant for advanced dementia (from memory care unit), hypertension, depression who presented to [facility] ED status post fall. She sustained left femoral neck fracture  Hospital Course:  Intertrochanteric fracture of left femur (HCC) - Status post fall - S/p intramedullary nail 7/10 by Dr. Ophelia Charter - she was monitored post op due to blood loss anemia, now stable, started on ASA by Ortho for DVT prophylaxis -PT eval completed and SNF recommended   Acute postoperative blood loss anemia - Hemoglobin dropped from 12.2 down to 9.7 -> now 8.2-8.4 range and stable -due to post op blood loss and hemodilution - No evidence of overt bleeding, now stable -ASA resumed now for DVT prophylaxis    Anxiety and depression - Continue lexapro   Hypothyroidism - TSH very high at 17, hence increased synthroid home dose from to , please repeat TSH to ensure it is trending down and increase Synthroid dose as needed   Advanced Dementia - Stable, pleasantly confused - From memory care unit   Benign essential HTN - Continue atenolol   Hypokalemia - resolved    Moderate protein calorie malnutrition - In the  context of dementia, chronic illness - Continue nutritional supplementation  Code Status: DNR/DNI  Procedures: PROCEDURE: Left intramedullary hip screw. Biomet AFFIXUS 11 x 340 with 90-mm lag screw, 42-mm distal interlock. By Dr.Mark Ophelia Charter on 7/10  Consultations:  Ortho Dr.Yates  Discharge Exam: Filed Vitals:   10/02/15 1300 10/02/15 2142  BP: 140/62 135/61  Pulse: 86 79  Temp: 97.8 F (36.6 C) 98.8 F (37.1 C)  Resp: 16 16    General: Alert, awake, confused Cardiovascular: S1S2/RRR Respiratory: CTAB  Discharge Instructions   Discharge Instructions    Call MD for:  difficulty breathing, headache or visual disturbances    Complete by:  As directed      Call MD for:  persistant dizziness or light-headedness    Complete by:  As directed      Call MD for:  persistant nausea and vomiting    Complete by:  As directed      Call MD for:  severe uncontrolled pain    Complete by:  As directed      Diet - low sodium heart healthy    Complete by:  As directed      Diet - low sodium heart healthy    Complete by:  As directed      Increase activity slowly    Complete by:  As directed      Increase activity slowly    Complete by:  As directed           Current Discharge Medication List    START taking these medications  Details  aspirin 325 MG EC tablet Take 1 tablet (325 mg total) by mouth daily with breakfast. Qty: 30 tablet, Refills: 0    feeding supplement, ENSURE ENLIVE, (ENSURE ENLIVE) LIQD Take 237 mLs by mouth 2 (two) times daily between meals. Qty: 237 mL, Refills: 12    HYDROcodone-acetaminophen (NORCO/VICODIN) 5-325 MG tablet Take 1 tablet by mouth every 6 (six) hours as needed for moderate pain. Qty: 60 tablet, Refills: 0    senna (SENOKOT) 8.6 MG TABS tablet Take 1 tablet (8.6 mg total) by mouth 2 (two) times daily. Qty: 120 each, Refills: 0      CONTINUE these medications which have CHANGED   Details  levothyroxine (SYNTHROID, LEVOTHROID) 50 MCG  tablet Take 2 tablets (100 mcg total) by mouth daily before breakfast.      CONTINUE these medications which have NOT CHANGED   Details  acetaminophen (TYLENOL) 500 MG tablet Take 500 mg by mouth 2 (two) times daily.    atenolol (TENORMIN) 25 MG tablet Take 12.5 mg by mouth daily.    busPIRone (BUSPAR) 15 MG tablet Take 15 mg by mouth 2 (two) times daily.    docusate sodium (COLACE) 100 MG capsule Take 100 mg by mouth daily.    escitalopram (LEXAPRO) 10 MG tablet Take 15 mg by mouth daily.    Skin Protectants, Misc. (EUCERIN) cream Apply topically 2 (two) times daily.       Allergies  Allergen Reactions  . Aricept [Donepezil Hcl]    Follow-up Information    Schedule an appointment as soon as possible for a visit with Eldred Manges, MD.   Specialty:  Orthopedic Surgery   Why:  need return office visit 2 weeks postop.    Contact information:   [address] [place] [address] [phone]        The results of significant diagnostics from this hospitalization (including imaging, microbiology, ancillary and laboratory) are listed below for reference.    Significant Diagnostic Studies: Dg Chest 1 View  09/29/2015  CLINICAL DATA:  Unwitnessed fall.  Hip fracture. EXAM: CHEST 1 VIEW COMPARISON:  None. FINDINGS: Mild cardiomegaly. There is tortuosity of the thoracic aorta with atherosclerosis. Mild coarse interstitial markings without evidence of pulmonary edema. No confluent airspace disease, pleural effusion or pneumothorax. Scoliotic curvature in degenerative change in the spine. No evidence of displaced rib fracture. IMPRESSION: Mild cardiomegaly and tortuous atherosclerotic thoracic aorta. No definite acute abnormality. Electronically Signed   By: Rubye Oaks M.D.   On: 09/29/2015 04:01   Dg Pelvis 1-2 Views  09/29/2015  CLINICAL DATA:  82 year old post unwitnessed fall at nursing facility this morning. EXAM: PELVIS - 1-2 VIEW COMPARISON:  None. FINDINGS: Left  subcapital femoral neck fracture with mild foreshortening. Right hip is grossly intact. Two weeks of the cysts and sacroiliac joints are congruent. No evidence of pelvic ring fracture. The bones are under mineralized. There is degenerative change of the lower lumbar spine. A stool ball distends the rectum. IMPRESSION: Mildly displaced left subcapital femoral neck fracture. Electronically Signed   By: Rubye Oaks M.D.   On: 09/29/2015 03:16   Dg C-arm 1-60 Min  09/30/2015  CLINICAL DATA:  LEFT femoral nailing EXAM: LEFT FEMUR 2 VIEWS; DG C-ARM 61-120 MIN COMPARISON:  09/29/2015 FLUOROSCOPY TIME:  0 minutes 46 seconds Images obtained:  4 FINDINGS: Images demonstrate placement of an IM nail with compression screw across an intertrochanteric fracture of the LEFT femur. Distal locking screw present. Bones appear demineralized. No dislocation. Joint  space narrowing medial compartment LEFT knee. IMPRESSION: Post ORIF LEFT femur. Electronically Signed   By: Ulyses SouthwardMark  Boles M.D.   On: 09/30/2015 12:22   Dg Hip Unilat With Pelvis 2-3 Views Left  09/29/2015  CLINICAL DATA:  Unwitnessed fall at nursing facility this morning. Hip fracture. EXAM: DG HIP (WITH OR WITHOUT PELVIS) 2-3V LEFT COMPARISON:  Pelvis radiograph earlier this day. FINDINGS: The left proximal femur fracture involves the intertrochanteric femur. The femoral neck is intact. There is mild displacement. Femoral head is seated in the acetabulum. No rami fractures. IMPRESSION: Left proximal femur fracture involves the intertrochanteric femur, not the femoral neck as reported on prior exam. The femoral neck appears intact. Electronically Signed   By: Rubye OaksMelanie  Ehinger M.D.   On: 09/29/2015 04:00   Dg Femur Min 2 Views Left  09/30/2015  CLINICAL DATA:  LEFT femoral nailing EXAM: LEFT FEMUR 2 VIEWS; DG C-ARM 61-120 MIN COMPARISON:  09/29/2015 FLUOROSCOPY TIME:  0 minutes 46 seconds Images obtained:  4 FINDINGS: Images demonstrate placement of an IM nail with  compression screw across an intertrochanteric fracture of the LEFT femur. Distal locking screw present. Bones appear demineralized. No dislocation. Joint space narrowing medial compartment LEFT knee. IMPRESSION: Post ORIF LEFT femur. Electronically Signed   By: Ulyses SouthwardMark  Boles M.D.   On: 09/30/2015 12:22    Microbiology: Recent Results (from the past 240 hour(s))  Urine culture     Status: Abnormal   Collection Time: 09/29/15  4:40 AM  Result Value Ref Range Status   Specimen Description URINE, CATHETERIZED  Final   Special Requests Normal  Final   Culture >=100,000 COLONIES/mL ESCHERICHIA COLI (A)  Final   Report Status 10/02/2015 FINAL  Final   Organism ID, Bacteria ESCHERICHIA COLI (A)  Final      Susceptibility   Escherichia coli - MIC*    AMPICILLIN <=2 SENSITIVE Sensitive     CEFAZOLIN <=4 SENSITIVE Sensitive     CEFTRIAXONE <=1 SENSITIVE Sensitive     CIPROFLOXACIN 1 SENSITIVE Sensitive     GENTAMICIN <=1 SENSITIVE Sensitive     IMIPENEM <=0.25 SENSITIVE Sensitive     NITROFURANTOIN <=16 SENSITIVE Sensitive     TRIMETH/SULFA >=320 RESISTANT Resistant     AMPICILLIN/SULBACTAM <=2 SENSITIVE Sensitive     PIP/TAZO <=4 SENSITIVE Sensitive     * >=100,000 COLONIES/mL ESCHERICHIA COLI  MRSA PCR Screening     Status: None   Collection Time: 09/29/15  8:49 PM  Result Value Ref Range Status   MRSA by PCR NEGATIVE NEGATIVE Final    Comment:        The GeneXpert MRSA Assay (FDA approved for NASAL specimens only), is one component of a comprehensive MRSA colonization surveillance program. It is not intended to diagnose MRSA infection nor to guide or monitor treatment for MRSA infections.      Labs: Basic Metabolic Panel:  Recent Labs Lab 09/29/15 0412 09/29/15 1142 09/30/15 0322 10/01/15 0431 10/02/15 0415 10/03/15 0002  NA 137  --  139 137 140 137  K 3.4*  --  4.0 4.1 3.9 4.1  CL 104  --  108 105 107 106  CO2 27  --  24 23 28 26   GLUCOSE 107*  --  96 107* 105* 97   BUN 32*  --  18 29* 29* 27*  CREATININE 0.77 0.70 0.76 1.11* 0.82 0.67  CALCIUM 8.5*  --  8.9 8.4* 8.2* 8.4*   Liver Function Tests:  Recent Labs Lab 09/29/15 [phone]  AST 29  ALT 25  ALKPHOS 43  BILITOT 1.3*  PROT 6.4*  ALBUMIN 4.1   No results for input(s): LIPASE, AMYLASE in the last 168 hours. No results for input(s): AMMONIA in the last 168 hours. CBC:  Recent Labs Lab 09/29/15 0412  09/30/15 0322 10/01/15 0431 10/02/15 0415 10/02/15 1134 10/03/15 0002  WBC 7.2  < > 5.9 9.7 6.1 6.6 6.5  NEUTROABS 5.5  --   --   --   --   --   --   HGB 13.1  < > 12.2 9.7* 8.3* 8.2* 8.4*  HCT 39.4  < > 38.2 29.5* 25.2* 25.6* 26.0*  MCV 94.5  < > 94.8 94.6 94.7 95.2 95.9  PLT 163  < > 175 163 134* 146* 166  < > = values in this interval not displayed. Cardiac Enzymes: No results for input(s): CKTOTAL, CKMB, CKMBINDEX, TROPONINI in the last 168 hours. BNP: BNP (last 3 results) No results for input(s): BNP in the last 8760 hours.  ProBNP (last 3 results) No results for input(s): PROBNP in the last 8760 hours.  CBG: No results for input(s): GLUCAP in the last 168 hours.     SignedZannie Cove MD.  [facility] 10/03/2015, 11:44 AM

## 2017-06-13 IMAGING — DX DG PELVIS 1-2V
1 series · 1 of 1 positions shown · non-contrast
Comparison: None.

CLINICAL DATA: [AGE] post unwitnessed fall at nursing
facility this morning.

EXAM:
PELVIS - 1-2 VIEW

[pelvis ap]
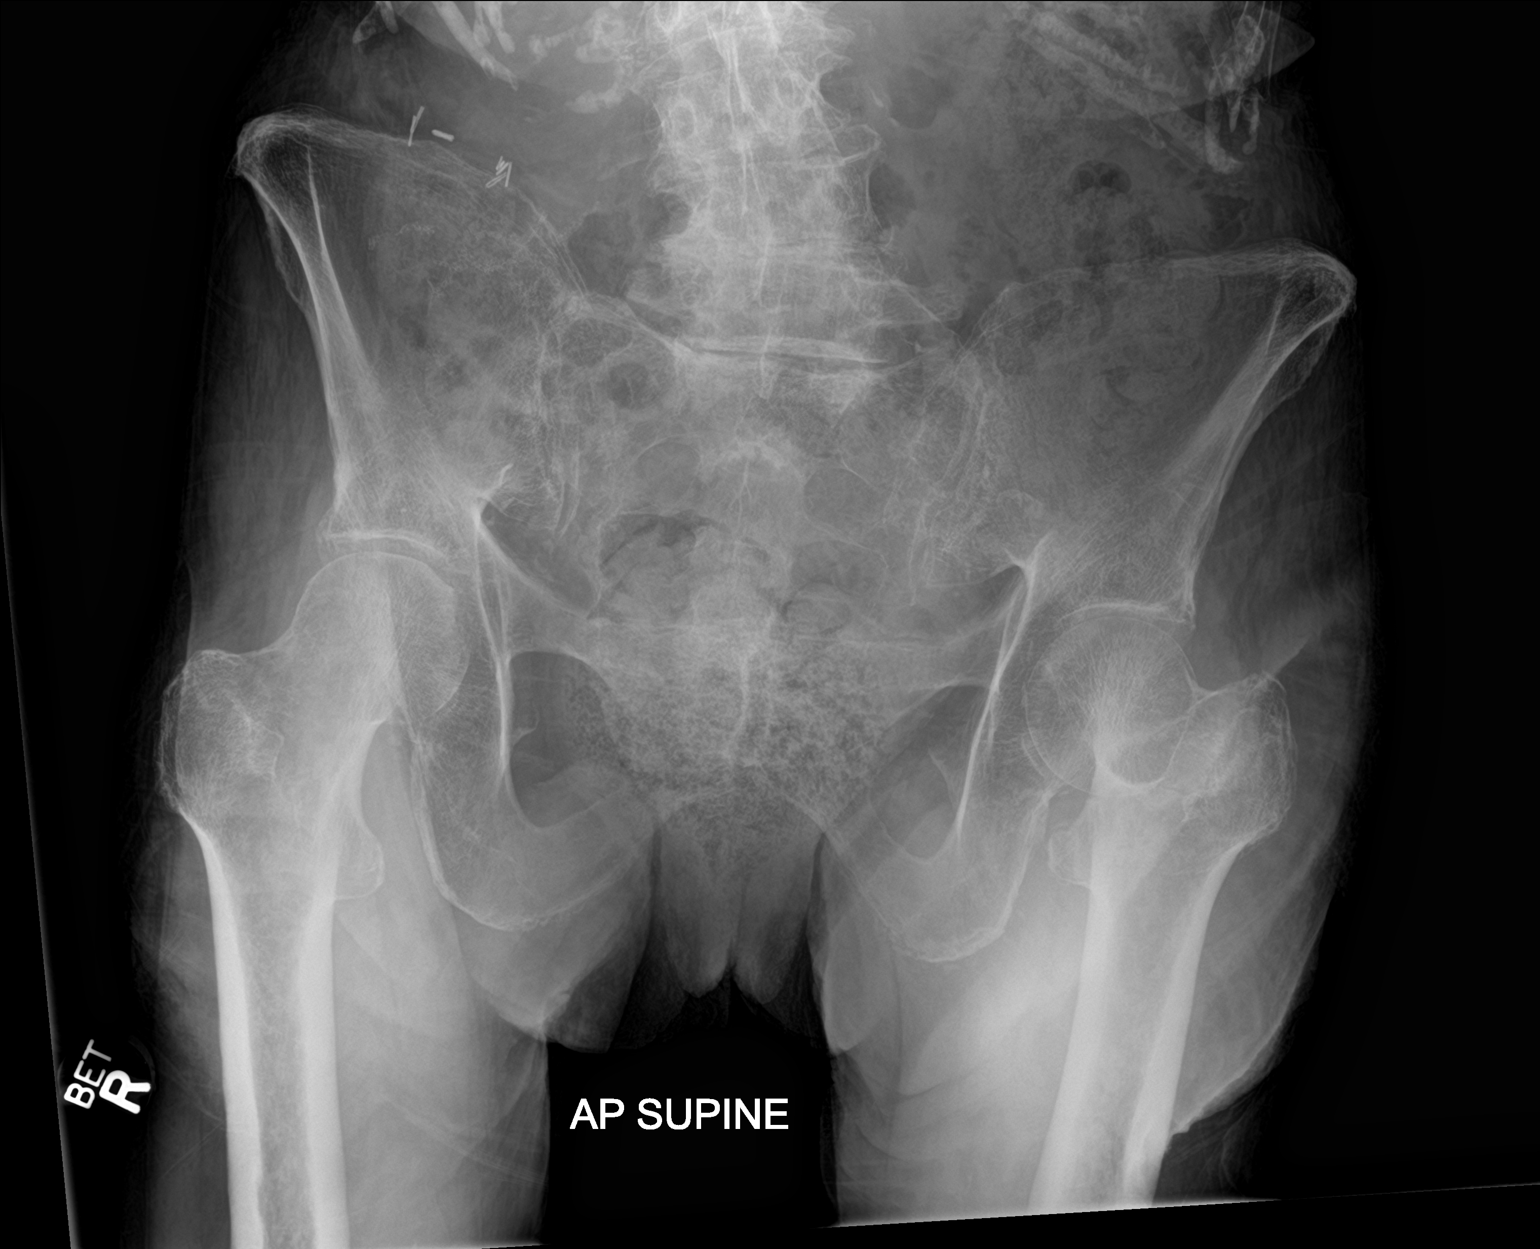

[1 of 1 positions shown; findings below may reference images not displayed]

FINDINGS: Left subcapital femoral neck fracture with mild foreshortening.
Right hip is grossly intact. Two weeks of the cysts and sacroiliac
joints are congruent. No evidence of pelvic ring fracture. The bones
are under mineralized. There is degenerative change of the lower
lumbar spine. A stool ball distends the rectum.
IMPRESSION: Mildly displaced left subcapital femoral neck fracture.

## 2017-06-13 IMAGING — DX DG CHEST 1V
1 series · 1 of 1 positions shown · non-contrast
Comparison: None.

CLINICAL DATA: Unwitnessed fall.  Hip fracture.

EXAM:
CHEST 1 VIEW

[chest ap]
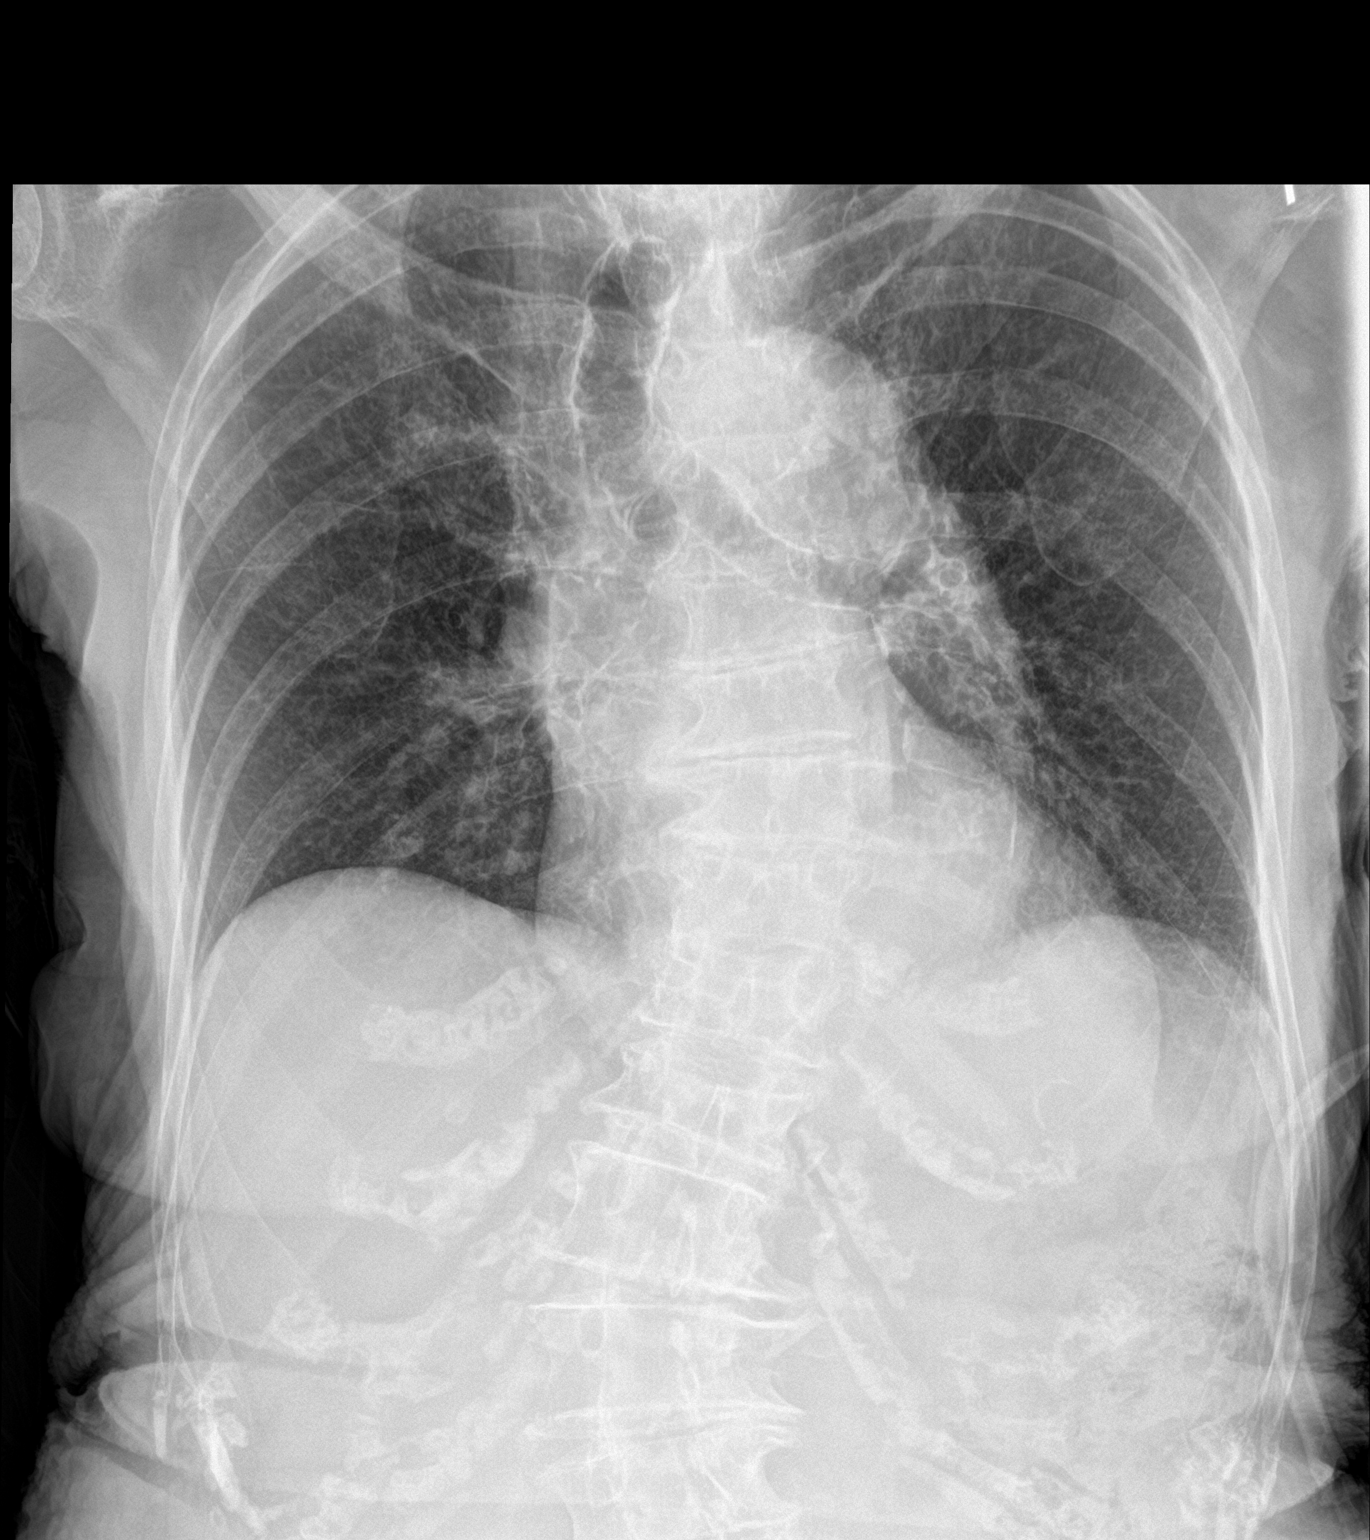

[1 of 1 positions shown; findings below may reference images not displayed]

FINDINGS: Mild cardiomegaly. There is tortuosity of the thoracic aorta with
atherosclerosis. Mild coarse interstitial markings without evidence
of pulmonary edema. No confluent airspace disease, pleural effusion
or pneumothorax. Scoliotic curvature in degenerative change in the
spine. No evidence of displaced rib fracture.
IMPRESSION: Mild cardiomegaly and tortuous atherosclerotic thoracic aorta. No
definite acute abnormality.

## 2017-06-13 IMAGING — DX DG HIP (WITH OR WITHOUT PELVIS) 2-3V*L*
2 series · 2 of 2 positions shown · non-contrast
Comparison: Pelvis radiograph earlier this day.

CLINICAL DATA: Unwitnessed fall at nursing facility this morning.
Hip fracture.

EXAM:
DG HIP (WITH OR WITHOUT PELVIS) 2-3V LEFT

[hip ap]
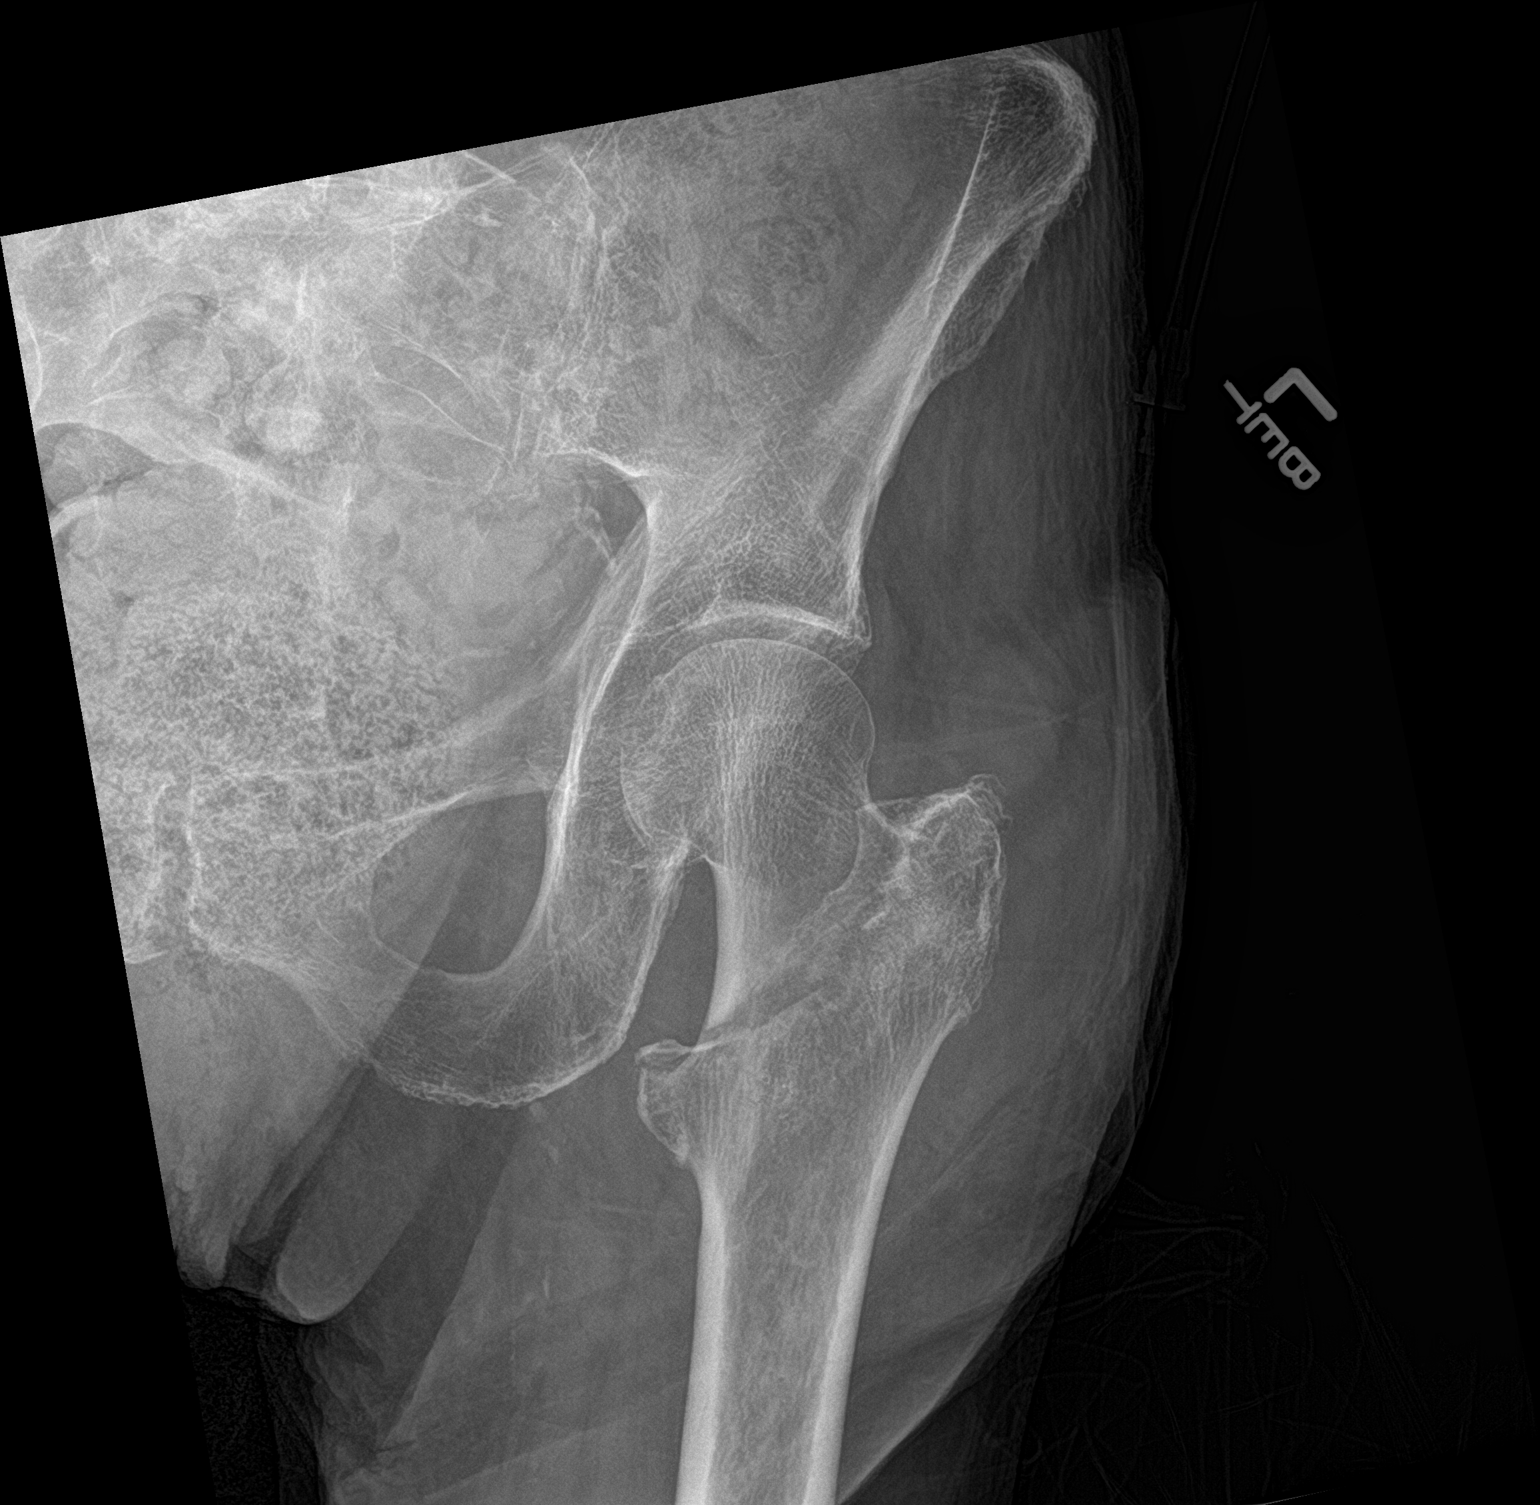

[hip lat]
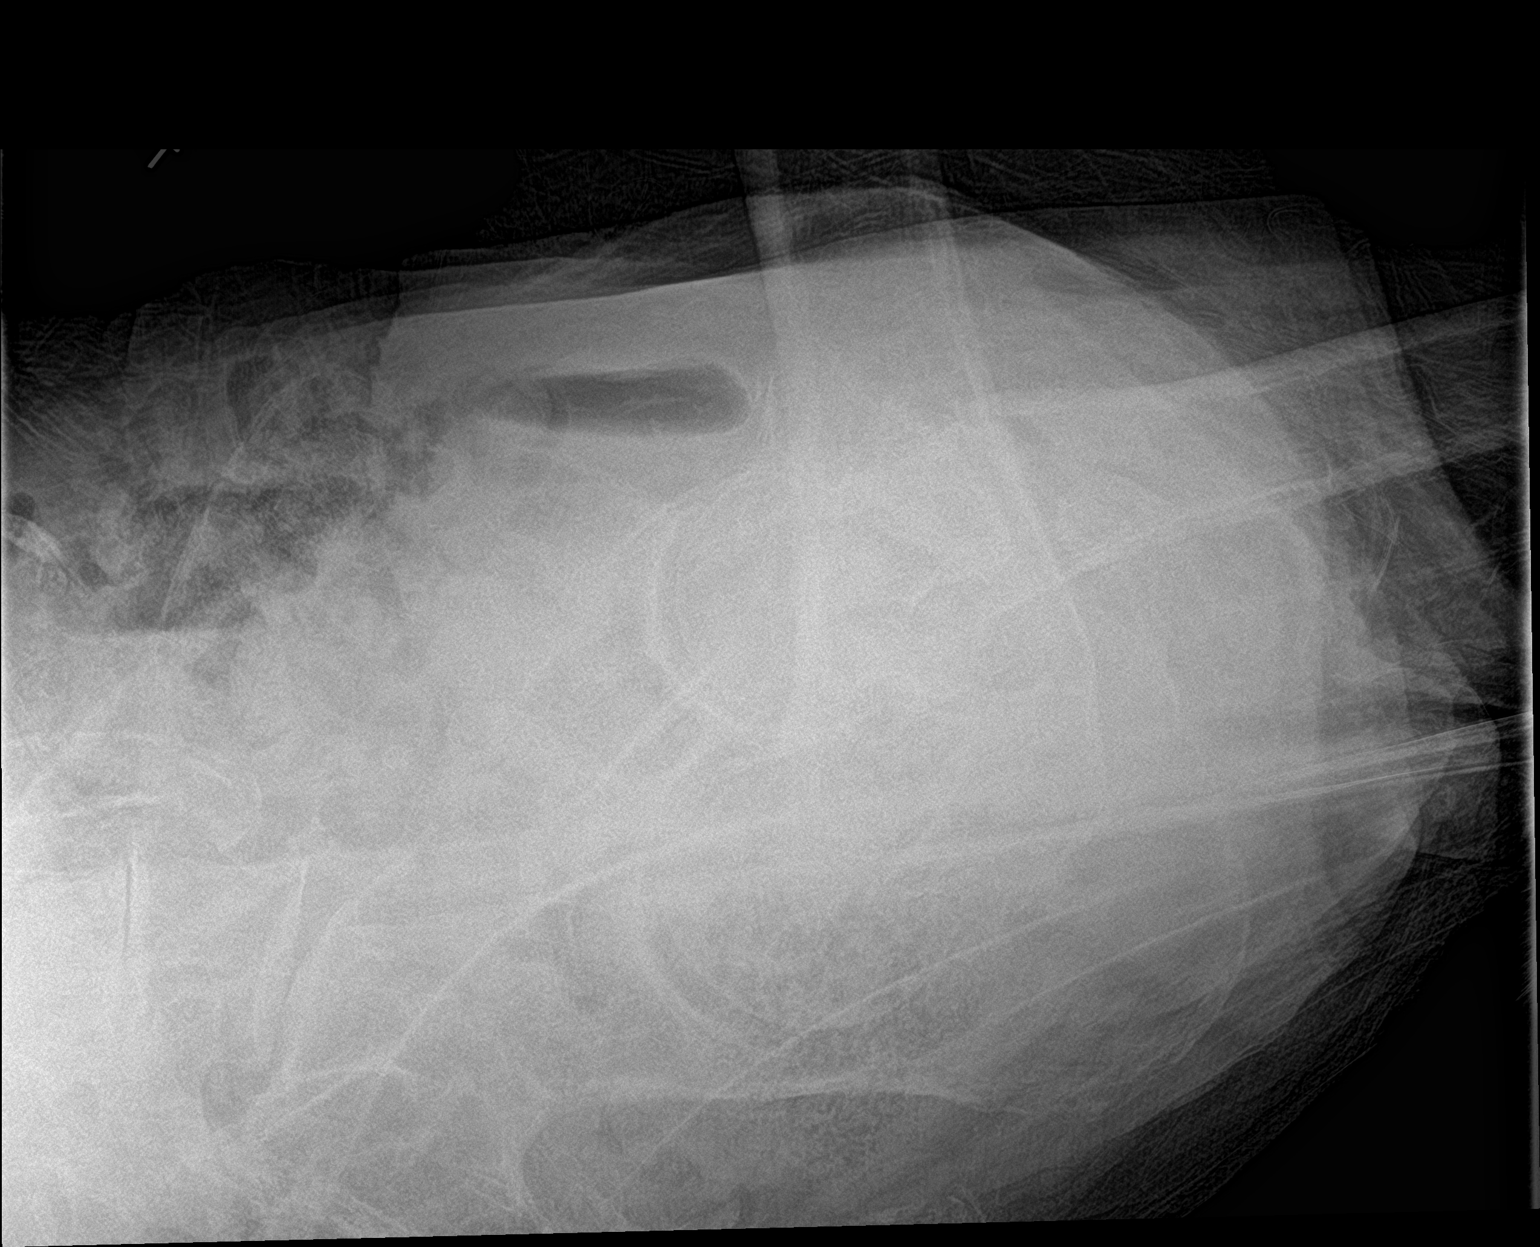

[2 of 2 positions shown; findings below may reference images not displayed]

FINDINGS: The left proximal femur fracture involves the intertrochanteric
femur. The femoral neck is intact. There is mild displacement.
Femoral head is seated in the acetabulum. No rami fractures.
IMPRESSION: Left proximal femur fracture involves the intertrochanteric femur,
not the femoral neck as reported on prior exam. The femoral neck
appears intact.

## 2017-07-21 DEATH — deceased
